# Patient Record
Sex: Male | Born: 1985 | Race: White | Hispanic: No | Smoking: Current every day smoker
Health system: Southern US, Community
[De-identification: ages and names within clinical notes are randomized; demographics above are authoritative.]

## PROBLEM LIST (undated history)

## (undated) DIAGNOSIS — F319 Bipolar disorder, unspecified: Secondary | ICD-10-CM

---
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
## 2005-06-25 ENCOUNTER — Emergency Department: Payer: Self-pay | Admitting: Emergency Medicine

## 2006-08-09 ENCOUNTER — Emergency Department: Payer: Self-pay | Admitting: Emergency Medicine

## 2007-05-29 ENCOUNTER — Emergency Department: Payer: Self-pay

## 2007-07-08 ENCOUNTER — Emergency Department: Payer: Self-pay | Admitting: Emergency Medicine

## 2008-02-05 ENCOUNTER — Other Ambulatory Visit: Payer: Self-pay

## 2008-02-05 ENCOUNTER — Emergency Department: Payer: Self-pay | Admitting: Emergency Medicine

## 2008-02-09 ENCOUNTER — Emergency Department: Payer: Self-pay | Admitting: Emergency Medicine

## 2008-04-30 ENCOUNTER — Emergency Department: Payer: Self-pay | Admitting: Emergency Medicine

## 2008-08-18 ENCOUNTER — Inpatient Hospital Stay: Payer: Self-pay | Admitting: Psychiatry

## 2009-08-08 ENCOUNTER — Emergency Department: Payer: Self-pay | Admitting: Emergency Medicine

## 2010-03-24 ENCOUNTER — Emergency Department: Payer: Self-pay | Admitting: Emergency Medicine

## 2010-04-24 ENCOUNTER — Emergency Department: Payer: Self-pay | Admitting: Emergency Medicine

## 2011-06-02 ENCOUNTER — Emergency Department: Payer: Self-pay | Admitting: Emergency Medicine

## 2012-04-02 ENCOUNTER — Emergency Department: Payer: Self-pay | Admitting: Emergency Medicine

## 2012-04-30 ENCOUNTER — Emergency Department: Payer: Self-pay | Admitting: Internal Medicine

## 2012-06-13 ENCOUNTER — Emergency Department: Payer: Self-pay | Admitting: Emergency Medicine

## 2012-12-26 ENCOUNTER — Emergency Department: Payer: Self-pay | Admitting: Emergency Medicine

## 2013-01-02 ENCOUNTER — Emergency Department: Payer: Self-pay | Admitting: Unknown Physician Specialty

## 2013-01-02 LAB — COMPREHENSIVE METABOLIC PANEL
Albumin: 4.6 g/dL (ref 3.4–5.0)
Alkaline Phosphatase: 102 U/L (ref 50–136)
Bilirubin,Total: 0.7 mg/dL (ref 0.2–1.0)
Co2: 25 mmol/L (ref 21–32)
EGFR (Non-African Amer.): >60
Osmolality: 275 (ref 275–301)
SGOT(AST): 27 U/L (ref 15–37)
SGPT (ALT): 43 U/L (ref 12–78)
Sodium: 138 mmol/L (ref 136–145)
Total Protein: 8.3 g/dL — ABNORMAL HIGH (ref 6.4–8.2)

## 2013-01-02 LAB — TSH: Thyroid Stimulating Horm: 1.33 u[IU]/mL

## 2013-01-02 LAB — CBC
HCT: 41.2 % (ref 40.0–52.0)
MCH: 28.8 pg (ref 26.0–34.0)
Platelet: 311 10*3/uL (ref 150–440)
RBC: 4.95 10*6/uL (ref 4.40–5.90)
RDW: 13.1 % (ref 11.5–14.5)

## 2013-01-02 LAB — DRUG SCREEN, URINE
Amphetamines, Ur Screen: NEGATIVE (ref ?–1000)
Barbiturates, Ur Screen: NEGATIVE (ref ?–200)
Cannabinoid 50 Ng, Ur ~~LOC~~: POSITIVE (ref ?–50)
Cocaine Metabolite,Ur ~~LOC~~: POSITIVE (ref ?–300)
MDMA (Ecstasy)Ur Screen: NEGATIVE (ref ?–500)
Opiate, Ur Screen: NEGATIVE (ref ?–300)
Phencyclidine (PCP) Ur S: NEGATIVE (ref ?–25)

## 2013-01-02 LAB — URINALYSIS, COMPLETE
Bacteria: NONE SEEN
Bilirubin,UR: NEGATIVE
Glucose,UR: NEGATIVE mg/dL (ref 0–75)
Ketone: NEGATIVE
Protein: NEGATIVE
Specific Gravity: 1.016 (ref 1.003–1.030)
WBC UR: <1 /HPF (ref 0–5)

## 2013-04-17 ENCOUNTER — Emergency Department: Payer: Self-pay | Admitting: Emergency Medicine

## 2013-09-15 ENCOUNTER — Emergency Department: Payer: Self-pay | Admitting: Emergency Medicine

## 2013-09-15 LAB — URINALYSIS, COMPLETE
BACTERIA: NONE SEEN
BILIRUBIN, UR: NEGATIVE
Blood: NEGATIVE
GLUCOSE, UR: NEGATIVE mg/dL (ref 0–75)
Ketone: NEGATIVE
LEUKOCYTE ESTERASE: NEGATIVE
Nitrite: NEGATIVE
Ph: 6 (ref 4.5–8.0)
Protein: NEGATIVE
Specific Gravity: 1.02 (ref 1.003–1.030)
Squamous Epithelial: NONE SEEN

## 2013-11-27 ENCOUNTER — Emergency Department: Payer: Self-pay | Admitting: Internal Medicine

## 2014-11-16 ENCOUNTER — Emergency Department: Payer: Self-pay | Admitting: Emergency Medicine
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
## 2015-01-26 ENCOUNTER — Encounter: Payer: Self-pay | Admitting: Emergency Medicine

## 2015-01-26 ENCOUNTER — Emergency Department
Admission: EM | Admit: 2015-01-26 | Discharge: 2015-01-26 | Disposition: A | Discharge status: Home / Self Care | Payer: Self-pay | Attending: Emergency Medicine | Admitting: Emergency Medicine

## 2015-01-26 DIAGNOSIS — W25XXXS Contact with sharp glass, sequela: Secondary | ICD-10-CM | POA: Insufficient documentation

## 2015-01-26 DIAGNOSIS — S61511S Laceration without foreign body of right wrist, sequela: Secondary | ICD-10-CM | POA: Insufficient documentation

## 2015-01-26 DIAGNOSIS — Z720 Tobacco use: Secondary | ICD-10-CM | POA: Insufficient documentation

## 2015-01-26 DIAGNOSIS — S41111S Laceration without foreign body of right upper arm, sequela: Secondary | ICD-10-CM

## 2015-01-26 MED ORDER — IBUPROFEN 800 MG PO TABS: 800.0000 mg | ORAL_TABLET | Freq: Three times a day (TID) | ORAL | Status: AC | PRN

## 2015-01-26 MED ORDER — OXYCODONE-ACETAMINOPHEN 5-325 MG PO TABS: 1.0000 | ORAL_TABLET | ORAL | Status: DC | PRN

## 2015-01-26 NOTE — ED Notes (Signed)
Had laceration sewn 6 weeks ago, no follow up, painful now

## 2015-01-26 NOTE — ED Provider Notes (Signed)
[facility] Emergency Department Provider Note  ____________________________________________  Time seen: Approximately 2:12 PM  I have reviewed the triage vital signs and the nursing notes.   HISTORY  Chief Complaint Wrist Pain   HPI Kevin Curry is a 29 y.o. male who presents with continued wrist pain secondary to laceration to his right wrist approximately 6 weeks ago. Denies any additional trauma, however complains of increased pain with trying to work. Does not have any insurance and is coming on the use of his arm and hand for work. States he would like a refill on antibiotics and pain medication. His pain as an 8/10 this morning which is worse it's been   History reviewed. No pertinent past medical history.  There are no active problems to display for this patient.   History reviewed. No pertinent past surgical history.  Current Outpatient Rx  Name  Route  Sig  Dispense  Refill  . ibuprofen (ADVIL,MOTRIN) 800 MG tablet   Oral   Take 1 tablet (800 mg total) by mouth every 8 (eight) hours as needed.   30 tablet   0   . oxyCODONE-acetaminophen (ROXICET) 5-325 MG per tablet   Oral   Take 1-2 tablets by mouth every 4 (four) hours as needed for severe pain.   15 tablet   0     Allergies Codeine  No family history on file.  Social History History  Substance Use Topics  . Smoking status: Current Every Day Smoker -- 0.50 packs/day    Types: Cigarettes  . Smokeless tobacco: Not on file  . Alcohol Use: No    Review of Systems Constitutional: No fever/chills Eyes: No visual changes. ENT: No sore throat. Cardiovascular: Denies chest pain. Respiratory: Denies shortness of breath. Gastrointestinal: No abdominal pain.  No nausea, no vomiting.  No diarrhea.  No constipation. Genitourinary: Negative for dysuria. Musculoskeletal: Negative for back pain. Skin: Negative for rash. Neurological: Negative for headaches, focal weakness or  numbness.  10-point ROS otherwise negative.  ____________________________________________   PHYSICAL EXAM:  VITAL SIGNS: ED Triage Vitals  Enc Vitals Group     BP 01/26/15 1334 141/76 mmHg     Pulse Rate 01/26/15 1334 60     Resp 01/26/15 1334 18     Temp 01/26/15 1334 98.2 F (36.8 C)     Temp Source 01/26/15 1334 Oral     SpO2 01/26/15 1334 100 %     Weight 01/26/15 1334 210 lb (95.255 kg)     Height 01/26/15 1334 6\' 1"  (1.854 m)     Head Cir --      Peak Flow --      Pain Score 01/26/15 1335 8     Pain Loc --      Pain Edu? --      Excl. in GC? --     Constitutional: Alert and oriented. Well appearing and in no acute distress. Eyes: Conjunctivae are normal. PERRL. EOMI. Head: Atraumatic. Nose: No congestion/rhinnorhea. Mouth/Throat: Mucous membranes are moist.  Oropharynx non-erythematous. Neck: No stridor.   Cardiovascular: Normal rate, regular rhythm. Grossly normal heart sounds.  Good peripheral circulation. Respiratory: Normal respiratory effort.  No retractions. Lungs CTAB. Gastrointestinal: Soft and nontender. No distention. No abdominal bruits. No CVA tenderness. Musculoskeletal: No lower extremity tenderness nor edema.  No joint effusions. Neurologic:  Normal speech and language. No gross focal neurologic deficits are appreciated. Speech is normal. No gait instability. Skin:  Skin is warm, dry and intact. No  rash noted. Laceration right wrist healed no evidence of erythema positive strength noted bilateral good distal capillary refills. Fingers extremely greasy from mechanics. Psychiatric: Mood and affect are normal. Speech and behavior are normal.  ____________________________________________   LABS (all labs ordered are listed, but only abnormal results are displayed)  Labs Reviewed - No data to display ____________________________________________  EKG  Not  applicable ____________________________________________  RADIOLOGY  None  ____________________________________________   PROCEDURES  Procedure(s) performed: None  Critical Care performed: No  ____________________________________________   INITIAL IMPRESSION / ASSESSMENT AND PLAN / ED COURSE  Pertinent labs & imaging results that were available during my care of the patient were reviewed by me and considered in my medical decision making (see chart for details). NCCS[place] reviewed. No history of controlled drug use. Will treat for 3 days of pain management. Patient understands this is not a pain clinic and he will need to see the orthopedic surgeon as needed for continued pain in his wrist. No antibiotics needed at this time. Patient voices no other emergency medical complaints today.  ____________________________________________   FINAL CLINICAL IMPRESSION(S) / ED DIAGNOSES  Final diagnoses:  Laceration of arm, right, complicated, sequela      Evangeline Dakin, PA-C 01/26/15 1425  Phineas Semen, MD 01/26/15 [phone]

## 2015-02-08 ENCOUNTER — Emergency Department
Admission: EM | Admit: 2015-02-08 | Discharge: 2015-02-08 | Disposition: A | Discharge status: Home / Self Care | Payer: Self-pay | Attending: Emergency Medicine | Admitting: Emergency Medicine

## 2015-02-08 DIAGNOSIS — X58XXXS Exposure to other specified factors, sequela: Secondary | ICD-10-CM | POA: Insufficient documentation

## 2015-02-08 DIAGNOSIS — Z720 Tobacco use: Secondary | ICD-10-CM | POA: Insufficient documentation

## 2015-02-08 DIAGNOSIS — S61511S Laceration without foreign body of right wrist, sequela: Secondary | ICD-10-CM | POA: Insufficient documentation

## 2015-02-08 MED ORDER — NAPROXEN 500 MG PO TABS
500.0000 mg | ORAL_TABLET | Freq: Once | ORAL | Status: AC
  Administered 2015-02-08: 500 mg via ORAL

## 2015-02-08 MED ORDER — NAPROXEN 500 MG PO TABS
ORAL_TABLET | ORAL | Status: AC
  Filled 2015-02-08: qty 1

## 2015-02-08 MED ORDER — NAPROXEN SODIUM 275 MG PO TABS: 275.0000 mg | ORAL_TABLET | Freq: Two times a day (BID) | ORAL | Status: AC

## 2015-02-08 NOTE — ED Notes (Signed)
Pt c/o right wrist pain , states he cut it about a month ago involving tendons and was told to return for continued sx due to lack of insurance, etc.

## 2015-02-08 NOTE — Discharge Instructions (Signed)
Advised patient to follow up with Ortho clinic for definitive care.  ER visits will not resolved this problem.  No need for antibiotics or naracotic pain medications.

## 2015-02-08 NOTE — ED Provider Notes (Signed)
[facility] Emergency Department Provider Note  ____________________________________________  Time seen: Approximately 3:50 PM  I have reviewed the triage vital signs and the nursing notes.   HISTORY  Chief Complaint Wrist Pain    HPI Kevin Curry is a 29 y.o. male patient returns to ER status post laceration with possible involvement of the tendon of the right wrist which  occurred6 weeks ago. This patient's second visit since suture removal. Patient state he was told to continue to follow with the emergency room due to lack of insurance. Read discharge instructions to the patient from his last visit was told him to follow orthopedics and stated the emergency room. Patient state continues to work but limited capacity secondary to the sequela of his right wrist. Patient is rating his pain as a 7/10. It is noted that the patient is left-hand dominant. States flexion and extension of the wrists increases the pain states no palliative measures as he is out of narcotic pain medication.   History reviewed. No pertinent past medical history.  There are no active problems to display for this patient.   History reviewed. No pertinent past surgical history.  Current Outpatient Rx  Name  Route  Sig  Dispense  Refill  . ibuprofen (ADVIL,MOTRIN) 800 MG tablet   Oral   Take 1 tablet (800 mg total) by mouth every 8 (eight) hours as needed.   30 tablet   0   . naproxen sodium (ANAPROX) 275 MG tablet   Oral   Take 1 tablet (275 mg total) by mouth 2 (two) times daily with a meal.   30 tablet   2   . oxyCODONE-acetaminophen (ROXICET) 5-325 MG per tablet   Oral   Take 1-2 tablets by mouth every 4 (four) hours as needed for severe pain.   15 tablet   0     Allergies Codeine  No family history on file.  Social History History  Substance Use Topics  . Smoking status: Current Every Day Smoker -- 0.50 packs/day    Types: Cigarettes  . Smokeless  tobacco: Not on file  . Alcohol Use: No    Review of Systems Constitutional: No fever/chills Eyes: No visual changes. ENT: No sore throat. Cardiovascular: Denies chest pain. Respiratory: Denies shortness of breath. Gastrointestinal: No abdominal pain.  No nausea, no vomiting.  No diarrhea.  No constipation. Genitourinary: Negative for dysuria. Musculoskeletal: Right wrist pain Skin: Healed laceration to the right wrist. Neurological: Negative for headaches, focal weakness or numbness. 10-point ROS otherwise negative.  ____________________________________________   PHYSICAL EXAM:  VITAL SIGNS: ED Triage Vitals  Enc Vitals Group     BP 02/08/15 1406 128/89 mmHg     Pulse Rate 02/08/15 1406 91     Resp 02/08/15 1406 16     Temp 02/08/15 1406 98.3 F (36.8 C)     Temp Source 02/08/15 1406 Oral     SpO2 02/08/15 1406 98 %     Weight 02/08/15 1405 210 lb (95.255 kg)     Height 02/08/15 1405  (1.854 m)     Head Cir --      Peak Flow --      Pain Score 02/08/15 1405 7     Pain Loc --      Pain Edu? --      Excl. in GC? --     Constitutional: Alert and oriented. Well appearing and in no acute distress. Patient left-hand dominant Eyes: Conjunctivae are normal.  PERRL. EOMI. Head: Atraumatic. Nose: No congestion/rhinnorhea. Mouth/Throat: Mucous membranes are moist.  Oropharynx non-erythematous. Neck: No stridor.  No deformity for nuchal range of motion nontender palpation. Hematological/Lymphatic/Immunilogical: No cervical lymphadenopathy. Cardiovascular: Normal rate, regular rhythm. Grossly normal heart sounds.  Good peripheral circulation. Respiratory: Normal respiratory effort.  No retractions. Lungs CTAB. Gastrointestinal: Soft and nontender. No distention. No abdominal bruits. No CVA tenderness. Musculoskeletal: No deformity of the right wrist is neurovascular intact no obvious swelling. Patient is demonstrating full range of motion to complain of pain. Neurologic:   Normal speech and language. No gross focal neurologic deficits are appreciated. Speech is normal. No gait instability. Skin:  Skin is warm, dry and intact. No rash noted. Psychiatric: Mood and affect are normal. Speech and behavior are normal.  ____________________________________________   LABS (all labs ordered are listed, but only abnormal results are displayed)  Labs Reviewed - No data to display ____________________________________________  EKG   ____________________________________________  RADIOLOGY   ____________________________________________   PROCEDURES  Procedure(s) performed: None  Critical Care performed: No  ____________________________________________   INITIAL IMPRESSION / ASSESSMENT AND PLAN / ED COURSE  Pertinent labs & imaging results that were available during my care of the patient were reviewed by me and considered in my medical decision making (see chart for details).  Laceration of the right wrist with complications. ____________________________________________   FINAL CLINICAL IMPRESSION(S) / ED DIAGNOSES  Final diagnoses:  Laceration of wrist with complication, right, sequela      Kevin Reining, PA-C 02/08/15 1559  Sharyn Creamer, MD 02/09/15 [phone]

## 2015-07-17 ENCOUNTER — Encounter: Payer: Self-pay | Admitting: Emergency Medicine

## 2015-07-17 ENCOUNTER — Emergency Department: Payer: Self-pay

## 2015-07-17 ENCOUNTER — Emergency Department
Admission: EM | Admit: 2015-07-17 | Discharge: 2015-07-18 | Disposition: A | Discharge status: Home / Self Care | Payer: Self-pay | Attending: Emergency Medicine | Admitting: Emergency Medicine

## 2015-07-17 DIAGNOSIS — F17210 Nicotine dependence, cigarettes, uncomplicated: Secondary | ICD-10-CM | POA: Insufficient documentation

## 2015-07-17 DIAGNOSIS — Z791 Long term (current) use of non-steroidal anti-inflammatories (NSAID): Secondary | ICD-10-CM | POA: Insufficient documentation

## 2015-07-17 DIAGNOSIS — F1910 Other psychoactive substance abuse, uncomplicated: Secondary | ICD-10-CM

## 2015-07-17 DIAGNOSIS — F1410 Cocaine abuse, uncomplicated: Secondary | ICD-10-CM | POA: Insufficient documentation

## 2015-07-17 DIAGNOSIS — K0889 Other specified disorders of teeth and supporting structures: Secondary | ICD-10-CM | POA: Insufficient documentation

## 2015-07-17 DIAGNOSIS — R079 Chest pain, unspecified: Secondary | ICD-10-CM | POA: Insufficient documentation

## 2015-07-17 LAB — BASIC METABOLIC PANEL
ANION GAP: 6 (ref 5–15)
BUN: 13 mg/dL (ref 6–20)
CO2: 30 mmol/L (ref 22–32)
Calcium: 9.6 mg/dL (ref 8.9–10.3)
Chloride: 103 mmol/L (ref 101–111)
Creatinine, Ser: 1.27 mg/dL — ABNORMAL HIGH (ref 0.61–1.24)
GFR calc Af Amer: >60 mL/min (ref >60)
GFR calc non Af Amer: >60 mL/min (ref >60)
GLUCOSE: 88 mg/dL (ref 65–99)
POTASSIUM: 4.1 mmol/L (ref 3.5–5.1)
Sodium: 139 mmol/L (ref 135–145)

## 2015-07-17 LAB — CBC
HEMATOCRIT: 41.9 % (ref 40.0–52.0)
HEMOGLOBIN: 14.1 g/dL (ref 13.0–18.0)
MCH: 29.1 pg (ref 26.0–34.0)
MCHC: 33.6 g/dL (ref 32.0–36.0)
MCV: 86.5 fL (ref 80.0–100.0)
Platelets: 277 10*3/uL (ref 150–440)
RBC: 4.84 MIL/uL (ref 4.40–5.90)
RDW: 13.4 % (ref 11.5–14.5)
WBC: 14.7 10*3/uL — ABNORMAL HIGH (ref 3.8–10.6)

## 2015-07-17 LAB — TROPONIN I: Troponin I: <0.03 ng/mL (ref <0.031)

## 2015-07-17 NOTE — ED Provider Notes (Signed)
[facility] Emergency Department Provider Note  ____________________________________________  Time seen: Approximately 11:58 PM  I have reviewed the triage vital signs and the nursing notes.   HISTORY  Chief Complaint Chest Pain    HPI Kevin Curry is a 29 y.o. male who presents to the ED with a chief complaint of chest pain and dental pain. Patient reports a three-day history of central chest tightness, waxing/waning, lasting approximately one hour per episode. Tightness associated with radiating down his right arm with some shortness of breath during the episodes. Denies associated diaphoresis, nausea, dizziness, palpitations. Reports his father died 2 weeks ago, increased stress at work as well as cocaine use one week ago. Denies prior history of coronary artery disease. Denies recent travel or trauma.Also reports crown fell out of his upper left molar last week and complains of dental pain. He has been taking his girlfriend's amoxicillin.   Past medical history None   There are no active problems to display for this patient.   History reviewed. No pertinent past surgical history.  Current Outpatient Rx  Name  Route  Sig  Dispense  Refill  . ibuprofen (ADVIL,MOTRIN) 800 MG tablet   Oral   Take 1 tablet (800 mg total) by mouth every 8 (eight) hours as needed.   30 tablet   0   . naproxen sodium (ANAPROX) 275 MG tablet   Oral   Take 1 tablet (275 mg total) by mouth 2 (two) times daily with a meal.   30 tablet   2   . oxyCODONE-acetaminophen (ROXICET) 5-325 MG per tablet   Oral   Take 1-2 tablets by mouth every 4 (four) hours as needed for severe pain.   15 tablet   0     Allergies Codeine  Family History  Problem Relation Age of Onset  . Heart disease Father     Social History Social History  Substance Use Topics  . Smoking status: Current Every Day Smoker -- 0.50 packs/day for 5 years    Types: Cigarettes  . Smokeless  tobacco: None  . Alcohol Use: Yes     Comment: occasionaly    Review of Systems Constitutional: No fever/chills Eyes: No visual changes. ENT: Positive for dental pain. No sore throat. Cardiovascular: Positive for chest pain. Respiratory: Denies shortness of breath. Gastrointestinal: No abdominal pain.  No nausea, no vomiting.  No diarrhea.  No constipation. Genitourinary: Negative for dysuria. Musculoskeletal: Negative for back pain. Skin: Negative for rash. Neurological: Negative for headaches, focal weakness or numbness.  10-point ROS otherwise negative.  ____________________________________________   PHYSICAL EXAM:  VITAL SIGNS: ED Triage Vitals  Enc Vitals Group     BP 07/17/15 2201 129/69 mmHg     Pulse Rate 07/17/15 2201 69     Resp 07/17/15 2201 18     Temp 07/17/15 2201 98.1 F (36.7 C)     Temp Source 07/17/15 2201 Oral     SpO2 07/17/15 2201 98 %     Weight 07/17/15 2201 200 lb (90.719 kg)     Height 07/17/15 2201  (1.854 m)     Head Cir --      Peak Flow --      Pain Score 07/17/15 2202 3     Pain Loc --      Pain Edu? --      Excl. in GC? --     Constitutional: Alert and oriented. Well appearing and in no acute distress. Eyes: Conjunctivae  are normal. PERRL. EOMI. Head: Atraumatic. Nose: No congestion/rhinnorhea. Mouth/Throat: Mucous membranes are moist.  Oropharynx non-erythematous. Crown missing from upper left molar. There is no associated intraoral or extra oral swelling. There is no abscess. Neck: No stridor.  No carotid bruits. Cardiovascular: Normal rate, regular rhythm. Grossly normal heart sounds.  Good peripheral circulation. Respiratory: Normal respiratory effort.  No retractions. Lungs CTAB. Gastrointestinal: Soft and nontender. No distention. No abdominal bruits. No CVA tenderness. Musculoskeletal: No lower extremity tenderness nor edema.  No joint effusions. Neurologic:  Normal speech and language. No gross focal neurologic deficits  are appreciated. No gait instability. Skin:  Skin is warm, dry and intact. No rash noted. Psychiatric: Mood and affect are normal. Speech and behavior are normal.  ____________________________________________   LABS (all labs ordered are listed, but only abnormal results are displayed)  Labs Reviewed  BASIC METABOLIC PANEL - Abnormal; Notable for the following:    Creatinine, Ser 1.27 (*)    All other components within normal limits  CBC - Abnormal; Notable for the following:    WBC 14.7 (*)    All other components within normal limits  TROPONIN I   ____________________________________________  EKG  ED ECG REPORT I, Pharell Rolfson J, the attending physician, personally viewed and interpreted this ECG.   Date: 07/18/2015  EKG Time: 2200  Rate: 72  Rhythm: normal EKG, normal sinus rhythm  Axis: Normal  Intervals:none  ST&T Change: Nonspecific  ____________________________________________  RADIOLOGY  Chest 2 view (viewed by me, interpreted per Dr. Manus GunningEhinger): No acute pulmonary process. ____________________________________________   PROCEDURES  Procedure(s) performed: None  Critical Care performed: No  ____________________________________________   INITIAL IMPRESSION / ASSESSMENT AND PLAN / ED COURSE  Pertinent labs & imaging results that were available during my care of the patient were reviewed by me and considered in my medical decision making (see chart for details).  29 year old male who presents with a 3 day history of chest pain; increased recent stressors as well as cocaine use. Patient has a normal EKG, negative troponin despite pain 3 days. Do not feel this is acute coronary syndrome but more likely secondary to stressors and cocaine use. Do not feel CK is indicated given patient is not complaining of muscle pain or spasms. Will initiate Ativan for pain; patient to continue amoxicillin for dental pain. Will refer patient to outpatient dental and cardiology  follow-ups. Strict return precautions given. Patient and girlfriend verbalize understanding and agree with plan of care. ____________________________________________   FINAL CLINICAL IMPRESSION(S) / ED DIAGNOSES  Final diagnoses:  Chest pain, unspecified chest pain type  Substance abuse  Pain, dental      Irean HongJade J Sharia Averitt, MD 07/18/15 [phone]

## 2015-07-17 NOTE — ED Notes (Addendum)
Patient states that he has had intermittent mid chest pain radiating down right arm times three days. Patient reports that he has had some shortness of breath with his chest pain. Patient reports using cocaine about 1 week ago.

## 2015-07-18 MED ORDER — LORAZEPAM 1 MG PO TABS
1.0000 mg | ORAL_TABLET | Freq: Once | ORAL | Status: AC
  Administered 2015-07-18: 1 mg via ORAL
  Filled 2015-07-18: qty 1

## 2015-07-18 MED ORDER — ACETAMINOPHEN 500 MG PO TABS
1000.0000 mg | ORAL_TABLET | Freq: Once | ORAL | Status: AC
  Administered 2015-07-18: 1000 mg via ORAL
  Filled 2015-07-18: qty 2

## 2015-07-18 NOTE — ED Notes (Signed)
Pt requesting pain meds for tooth pain upper left (throbbing) 10/10 to nothing

## 2015-07-18 NOTE — Discharge Instructions (Signed)
1. Avoid illicit drugs. 2. Continue amoxicillin for dental pain. 3. Return to the ER for worsening symptoms, persistent vomiting, difficulty breathing or other concerns.  Nonspecific Chest Pain  Chest pain can be caused by many different conditions. There is always a chance that your pain could be related to something serious, such as a heart attack or a blood clot in your lungs. Chest pain can also be caused by conditions that are not life-threatening. If you have chest pain, it is very important to follow up with your health care provider. CAUSES  Chest pain can be caused by:  Heartburn.  Pneumonia or bronchitis.  Anxiety or stress.  Inflammation around your heart (pericarditis) or lung (pleuritis or pleurisy).  A blood clot in your lung.  A collapsed lung (pneumothorax). It can develop suddenly on its own (spontaneous pneumothorax) or from trauma to the chest.  Shingles infection (varicella-zoster virus).  Heart attack.  Damage to the bones, muscles, and cartilage that make up your chest wall. This can include:  Bruised bones due to injury.  Strained muscles or cartilage due to frequent or repeated coughing or overwork.  Fracture to one or more ribs.  Sore cartilage due to inflammation (costochondritis). RISK FACTORS  Risk factors for chest pain may include:  Activities that increase your risk for trauma or injury to your chest.  Respiratory infections or conditions that cause frequent coughing.  Medical conditions or overeating that can cause heartburn.  Heart disease or family history of heart disease.  Conditions or health behaviors that increase your risk of developing a blood clot.  Having had chicken pox (varicella zoster). SIGNS AND SYMPTOMS Chest pain can feel like:  Burning or tingling on the surface of your chest or deep in your chest.  Crushing, pressure, aching, or squeezing pain.  Dull or sharp pain that is worse when you move, cough, or take a  deep breath.  Pain that is also felt in your back, neck, shoulder, or arm, or pain that spreads to any of these areas. Your chest pain may come and go, or it may stay constant. DIAGNOSIS Lab tests or other studies may be needed to find the cause of your pain. Your health care provider may have you take a test called an ambulatory ECG (electrocardiogram). An ECG records your heartbeat patterns at the time the test is performed. You may also have other tests, such as:  Transthoracic echocardiogram (TTE). During echocardiography, sound waves are used to create a picture of all of the heart structures and to look at how blood flows through your heart.  Transesophageal echocardiogram (TEE).This is a more advanced imaging test that obtains images from inside your body. It allows your health care provider to see your heart in finer detail.  Cardiac monitoring. This allows your health care provider to monitor your heart rate and rhythm in real time.  Holter monitor. This is a portable device that records your heartbeat and can help to diagnose abnormal heartbeats. It allows your health care provider to track your heart activity for several days, if needed.  Stress tests. These can be done through exercise or by taking medicine that makes your heart beat more quickly.  Blood tests.  Imaging tests. TREATMENT  Your treatment depends on what is causing your chest pain. Treatment may include:  Medicines. These may include:  Acid blockers for heartburn.  Anti-inflammatory medicine.  Pain medicine for inflammatory conditions.  Antibiotic medicine, if an infection is present.  Medicines to dissolve blood  clots.  Medicines to treat coronary artery disease.  Supportive care for conditions that do not require medicines. This may include:  Resting.  Applying heat or cold packs to injured areas.  Limiting activities until pain decreases. HOME CARE INSTRUCTIONS  If you were prescribed an  antibiotic medicine, finish it all even if you start to feel better.  Avoid any activities that bring on chest pain.  Do not use any tobacco products, including cigarettes, chewing tobacco, or electronic cigarettes. If you need help quitting, ask your health care provider.  Do not drink alcohol.  Take medicines only as directed by your health care provider.  Keep all follow-up visits as directed by your health care provider. This is important. This includes any further testing if your chest pain does not go away.  If heartburn is the cause for your chest pain, you may be told to keep your head raised (elevated) while sleeping. This reduces the chance that acid will go from your stomach into your esophagus.  Make lifestyle changes as directed by your health care provider. These may include:  Getting regular exercise. Ask your health care provider to suggest some activities that are safe for you.  Eating a heart-healthy diet. A registered dietitian can help you to learn healthy eating options.  Maintaining a healthy weight.  Managing diabetes, if necessary.  Reducing stress. SEEK MEDICAL CARE IF:  Your chest pain does not go away after treatment.  You have a rash with blisters on your chest.  You have a fever. SEEK IMMEDIATE MEDICAL CARE IF:   Your chest pain is worse.  You have an increasing cough, or you cough up blood.  You have severe abdominal pain.  You have severe weakness.  You faint.  You have chills.  You have sudden, unexplained chest discomfort.  You have sudden, unexplained discomfort in your arms, back, neck, or jaw.  You have shortness of breath at any time.  You suddenly start to sweat, or your skin gets clammy.  You feel nauseous or you vomit.  You suddenly feel light-headed or dizzy.  Your heart begins to beat quickly, or it feels like it is skipping beats. These symptoms may represent a serious problem that is an emergency. Do not wait to  see if the symptoms will go away. Get medical help right away. Call your local emergency services (911 in the [place] Do not drive yourself to the hospital.   This information is not intended to replace advice given to you by your health care provider. Make sure you discuss any questions you have with your health care provider.   Document Released: 05/25/2005 Document Revised: 09/05/2014 Document Reviewed: 03/21/2014 Elsevier Interactive Patient Education 2016 [employer].  Polysubstance Abuse When people abuse more than one drug or type of drug it is called polysubstance or polydrug abuse. For example, many smokers also drink alcohol. This is one form of polydrug abuse. Polydrug abuse also refers to the use of a drug to counteract an unpleasant effect produced by another drug. It may also be used to help with withdrawal from another drug. People who take stimulants may become agitated. Sometimes this agitation is countered with a tranquilizer. This helps protect against the unpleasant side effects. Polydrug abuse also refers to the use of different drugs at the same time.  Anytime drug use is interfering with normal living activities, it has become abuse. This includes problems with family and friends. Psychological dependence has developed when your mind tells you that  the drug is needed. This is usually followed by physical dependence which has developed when continuing increases of drug are required to get the same feeling or "high". This is known as addiction or chemical dependency. A person's risk is much higher if there is a history of chemical dependency in the family. SIGNS OF CHEMICAL DEPENDENCY  You have been told by friends or family that drugs have become a problem.  You fight when using drugs.  You are having blackouts (not remembering what you do while using).  You feel sick from using drugs but continue using.  You lie about use or amounts of drugs (chemicals) used.  You need  chemicals to get you going.  You are suffering in work performance or in school because of drug use.  You get sick from use of drugs but continue to use anyway.  You need drugs to relate to people or feel comfortable in social situations.  You use drugs to forget problems. "Yes" answered to any of the above signs of chemical dependency indicates there are problems. The longer the use of drugs continues, the greater the problems will become. If there is a family history of drug or alcohol use, it is best not to experiment with these drugs. Continual use leads to tolerance. After tolerance develops more of the drug is needed to get the same feeling. This is followed by addiction. With addiction, drugs become the most important part of life. It becomes more important to take drugs than participate in the other usual activities of life. This includes relating to friends and family. Addiction is followed by dependency. Dependency is a condition where drugs are now needed not just to get high, but to feel normal. Addiction cannot be cured but it can be stopped. This often requires outside help and the care of professionals. Treatment centers are listed in the yellow pages under: Cocaine, Narcotics, and Alcoholics Anonymous. Most hospitals and clinics can refer you to a specialized care center. Talk to your caregiver if you need help.   This information is not intended to replace advice given to you by your health care provider. Make sure you discuss any questions you have with your health care provider.   Document Released: 04/06/2005 Document Revised: 11/07/2011 Document Reviewed: 08/20/2014 Elsevier Interactive Patient Education [employer]2016 Elsevier Inc.

## 2016-04-10 ENCOUNTER — Emergency Department
Admission: EM | Admit: 2016-04-10 | Discharge: 2016-04-10 | Disposition: A | Discharge status: Home / Self Care | Payer: Self-pay | Attending: Emergency Medicine | Admitting: Emergency Medicine

## 2016-04-10 ENCOUNTER — Encounter: Payer: Self-pay | Admitting: *Deleted

## 2016-04-10 DIAGNOSIS — F17210 Nicotine dependence, cigarettes, uncomplicated: Secondary | ICD-10-CM | POA: Insufficient documentation

## 2016-04-10 DIAGNOSIS — M62830 Muscle spasm of back: Secondary | ICD-10-CM | POA: Insufficient documentation

## 2016-04-10 MED ORDER — NAPROXEN 500 MG PO TABS: 500.0000 mg | ORAL_TABLET | Freq: Two times a day (BID) | ORAL | 2 refills | Status: DC

## 2016-04-10 NOTE — ED Triage Notes (Signed)
Pt reports walking up a hill feeling a pull in right lower back, pt denies any other symptoms

## 2016-04-10 NOTE — ED Provider Notes (Signed)
[facility] Emergency Department Provider Note   ____________________________________________    I have reviewed the triage vital signs and the nursing notes.   HISTORY  Chief Complaint Back Pain     HPI Kevin Curry is a 30 y.o. male who presents with complaints of right lower back pain. Patient reports he was walking into a store and developed a spasm in his right lower back. He reports the pain is severe and 10 out of 10. He is ambulating well. No neuro deficits.   History reviewed. No pertinent past medical history.  There are no active problems to display for this patient.   History reviewed. No pertinent surgical history.  Prior to Admission medications   Medication Sig Start Date End Date Taking? Authorizing Provider  ibuprofen (ADVIL,MOTRIN) 800 MG tablet Take 1 tablet (800 mg total) by mouth every 8 (eight) hours as needed. 01/26/15   Charmayne Sheer Beers, PA-C  naproxen (NAPROSYN) 500 MG tablet Take 1 tablet (500 mg total) by mouth 2 (two) times daily with a meal. 04/10/16   Jene Every, MD  oxyCODONE-acetaminophen (ROXICET) 5-325 MG per tablet Take 1-2 tablets by mouth every 4 (four) hours as needed for severe pain. 01/26/15   Evangeline Dakin, PA-C     Allergies Codeine  Family History  Problem Relation Age of Onset  . Heart disease Father     Social History Social History  Substance Use Topics  . Smoking status: Current Every Day Smoker    Packs/day: 0.50    Years: 5.00    Types: Cigarettes  . Smokeless tobacco: Never Used  . Alcohol use Yes     Comment: occasionaly    Review of Systems  Constitutional: No Dizziness     Gastrointestinal: No abdominal pain.   Genitourinary: Negative for incontinence Musculoskeletal: As above.  Neurological: Negative for focal deficits    ____________________________________________   PHYSICAL EXAM:  VITAL SIGNS: ED Triage Vitals  Enc Vitals Group     BP 04/10/16  1148 (!) 143/75     Pulse Rate 04/10/16 1148 66     Resp 04/10/16 1148 18     Temp 04/10/16 1148 98.3 F (36.8 C)     Temp Source 04/10/16 1148 Oral     SpO2 04/10/16 1148 99 %     Weight 04/10/16 1144 200 lb (90.7 kg)     Height 04/10/16 1144  (1.854 m)     Head Circumference --      Peak Flow --      Pain Score 04/10/16 1144 10     Pain Loc --      Pain Edu? --      Excl. in GC? --      Constitutional: Alert and oriented. No acute distress. Pleasant and interactive   Respiratory: Normal respiratory effort.  No retractions. Genitourinary: deferred Musculoskeletal: Tenderness to palpation right lumbar paraspinal area consistent with muscle spasm. Trachea lower extremities. Neurologic:  Normal speech and language. No gross focal neurologic deficits are appreciated.   Skin:  Skin is warm, dry and intact. No rash noted.   ____________________________________________   LABS (all labs ordered are listed, but only abnormal results are displayed)  Labs Reviewed - No data to display ____________________________________________  EKG   ____________________________________________  RADIOLOGY  None ____________________________________________   PROCEDURES  Procedure(s) performed: No    Critical Care performed: No ____________________________________________   INITIAL IMPRESSION / ASSESSMENT AND PLAN / ED COURSE  Pertinent  labs & imaging results that were available during my care of the patient were reviewed by me and considered in my medical decision making (see chart for details).  History of present illness and exam is consistent with muscle spasm. We will treat conservatively with NSAIDs. Follow-up with PCP   ____________________________________________   FINAL CLINICAL IMPRESSION(S) / ED DIAGNOSES  Final diagnoses:  Muscle spasm of back      NEW MEDICATIONS STARTED DURING THIS VISIT:  Discharge Medication List as of 04/10/2016 12:06 PM      START taking these medications   Details  naproxen (NAPROSYN) 500 MG tablet Take 1 tablet (500 mg total) by mouth 2 (two) times daily with a meal., Starting Sun 04/10/2016, Print         Note:  This document was prepared using Dragon voice recognition software and may include unintentional dictation errors.    Jene Everyobert Durk Carmen, MD 04/10/16 1236

## 2016-08-04 ENCOUNTER — Emergency Department: Payer: Self-pay

## 2016-08-04 ENCOUNTER — Emergency Department
Admission: EM | Admit: 2016-08-04 | Discharge: 2016-08-04 | Disposition: A | Discharge status: Home / Self Care | Payer: Self-pay | Attending: Emergency Medicine | Admitting: Emergency Medicine

## 2016-08-04 ENCOUNTER — Encounter: Payer: Self-pay | Admitting: *Deleted

## 2016-08-04 DIAGNOSIS — F17210 Nicotine dependence, cigarettes, uncomplicated: Secondary | ICD-10-CM | POA: Insufficient documentation

## 2016-08-04 DIAGNOSIS — R079 Chest pain, unspecified: Secondary | ICD-10-CM | POA: Insufficient documentation

## 2016-08-04 DIAGNOSIS — Z791 Long term (current) use of non-steroidal anti-inflammatories (NSAID): Secondary | ICD-10-CM | POA: Insufficient documentation

## 2016-08-04 DIAGNOSIS — M542 Cervicalgia: Secondary | ICD-10-CM | POA: Insufficient documentation

## 2016-08-04 DIAGNOSIS — R0602 Shortness of breath: Secondary | ICD-10-CM | POA: Insufficient documentation

## 2016-08-04 DIAGNOSIS — M79662 Pain in left lower leg: Secondary | ICD-10-CM | POA: Insufficient documentation

## 2016-08-04 DIAGNOSIS — M79605 Pain in left leg: Secondary | ICD-10-CM

## 2016-08-04 LAB — BASIC METABOLIC PANEL
Anion gap: 11 (ref 5–15)
BUN: 13 mg/dL (ref 6–20)
CO2: 22 mmol/L (ref 22–32)
Calcium: 9.4 mg/dL (ref 8.9–10.3)
Chloride: 106 mmol/L (ref 101–111)
Creatinine, Ser: 0.89 mg/dL (ref 0.61–1.24)
GFR calc Af Amer: >60 mL/min (ref >60)
GFR calc non Af Amer: >60 mL/min (ref >60)
GLUCOSE: 108 mg/dL — AB (ref 65–99)
POTASSIUM: 3.7 mmol/L (ref 3.5–5.1)
SODIUM: 139 mmol/L (ref 135–145)

## 2016-08-04 LAB — CBC
HEMATOCRIT: 41.2 % (ref 40.0–52.0)
Hemoglobin: 14.4 g/dL (ref 13.0–18.0)
MCH: 30.3 pg (ref 26.0–34.0)
MCHC: 35 g/dL (ref 32.0–36.0)
MCV: 86.5 fL (ref 80.0–100.0)
PLATELETS: 272 10*3/uL (ref 150–440)
RBC: 4.76 MIL/uL (ref 4.40–5.90)
RDW: 13 % (ref 11.5–14.5)
WBC: 16.1 10*3/uL — ABNORMAL HIGH (ref 3.8–10.6)

## 2016-08-04 LAB — TROPONIN I: Troponin I: <0.03 ng/mL (ref <0.03)

## 2016-08-04 MED ORDER — GI COCKTAIL ~~LOC~~: 30.0000 mL | Freq: Once | ORAL | Status: DC

## 2016-08-04 MED ORDER — IOPAMIDOL (ISOVUE-370) INJECTION 76%
125.0000 mL | Freq: Once | INTRAVENOUS | Status: AC | PRN
  Administered 2016-08-04: 125 mL via INTRAVENOUS
  Filled 2016-08-04: qty 125

## 2016-08-04 MED ORDER — SODIUM CHLORIDE 0.9 % IV BOLUS (SEPSIS)
1000.0000 mL | Freq: Once | INTRAVENOUS | Status: AC
  Administered 2016-08-04: 1000 mL via INTRAVENOUS

## 2016-08-04 NOTE — ED Triage Notes (Signed)
Pt complains of central chest pain with pain radiating to neck, pt complains of dizziness

## 2016-08-04 NOTE — ED Notes (Signed)
Patient transported to CT 

## 2016-08-04 NOTE — ED Provider Notes (Signed)
[facility] Emergency Department Provider Note  ____________________________________________  Time seen: Approximately 6:59 PM  I have reviewed the triage vital signs and the nursing notes.   HISTORY  Chief Complaint Chest Pain    HPI Kevin Curry is a 30 y.o. male , otherwise healthy, presenting with leg, chest, and neck pain.  The patient reports that he was lying down watching a movie when he rolled over and had an acute severe pain that started in the mid thigh and shot up towards his chest and into the posterior neck. It felt like a thunder bowl of lightening to him. He had an immediate episode of "blacking out" that was very short and self limited. He has had associated diaphoresis and shortness of breath. At this time, he continues to have a central and left-sided chest pain that feels like heartburn, "burning sensation." He denies any numbness tingling or weakness, no dizziness or lightheadedness. No syncope.  Family history is negative for connective tissue disorder.   History reviewed. No pertinent past medical history.  There are no active problems to display for this patient.   History reviewed. No pertinent surgical history.  Current Outpatient Rx  . Order #: [id number] Class: Print  . Order #: [id number] Class: Print  . Order #: [id number] Class: Print    Allergies Codeine  Family History  Problem Relation Age of Onset  . Heart disease Father     Social History Social History  Substance Use Topics  . Smoking status: Current Every Day Smoker    Packs/day: 0.50    Years: 5.00    Types: Cigarettes  . Smokeless tobacco: Never Used  . Alcohol use Yes     Comment: occasionaly    Review of Systems Constitutional: No fever/chills.Positive diaphoresis. Eyes: No visual changes. ENT: No sore throat. No congestion or rhinorrhea. Cardiovascular: Positive chest pain. Denies palpitations. Respiratory: Positive shortness of breath.   No cough. Gastrointestinal: No abdominal pain.  No nausea, no vomiting.  No diarrhea.  No constipation. Genitourinary: Negative for dysuria. Musculoskeletal: Negative for back pain. Positive left leg pain. Skin: Negative for rash. Neurological: Negative for headaches. No focal numbness, tingling or weakness.   10-point ROS otherwise negative.  ____________________________________________   PHYSICAL EXAM:  VITAL SIGNS: ED Triage Vitals [08/04/16 1747]  Enc Vitals Group     BP (!) 158/96     Pulse Rate (!) 117     Resp 20     Temp 99.3 F (37.4 C)     Temp Source Oral     SpO2 96 %     Weight 190 lb (86.2 kg)     Height 6\' 1"  (1.854 m)     Head Circumference      Peak Flow      Pain Score      Pain Loc      Pain Edu?      Excl. in GC?     Constitutional: Alert and oriented. Well appearing and in no acute distress. Answers questions appropriately. Eyes: Conjunctivae are normal.  EOMI. No scleral icterus. Head: Atraumatic. Nose: No congestion/rhinnorhea. Mouth/Throat: Mucous membranes are moist.  Neck: No stridor.  Supple.  No JVD. Cardiovascular: Normal rate, regular rhythm. No murmurs, rubs or gallops.  Respiratory: Normal respiratory effort.  No accessory muscle use or retractions. Lungs CTAB.  No wheezes, rales or ronchi. Gastrointestinal: Soft, nontender and nondistended.  No guarding or rebound.  No peritoneal signs. Musculoskeletal: No LE edema. No ttp in the calves  or palpable cords.  Negative Homan's sign. Neurologic:  A&Ox3.  Speech is clear.  Face and smile are symmetric.  EOMI.  Moves all extremities well. Skin:  Skin is warm, dry and intact. No rash noted. Psychiatric: Mood and affect are normal. Speech and behavior are normal.  Normal judgement.  ____________________________________________   LABS (all labs ordered are listed, but only abnormal results are displayed)  Labs Reviewed  BASIC METABOLIC PANEL - Abnormal; Notable for the following:        Result Value   Glucose, Bld 108 (*)    All other components within normal limits  CBC - Abnormal; Notable for the following:    WBC 16.1 (*)    All other components within normal limits  TROPONIN I   ____________________________________________  EKG  ED ECG REPORT I, Rockne MenghiniNorman, Anne-Caroline, the attending physician, personally viewed and interpreted this ECG.   Date: 08/04/2016  EKG Time: 1752  Rate: 100  Rhythm: sinus tachycardia  Axis: normal  Intervals:none  ST&T Change: Nonspecific T-wave inversion. No ST elevation.  ____________________________________________  RADIOLOGY  Dg Chest 2 View  Result Date: 08/04/2016 CLINICAL DATA:  Left-sided sharp chest pain EXAM: CHEST  2 VIEW COMPARISON:  07/17/2015 FINDINGS: The heart size and mediastinal contours are within normal limits. Both lungs are clear. The visualized skeletal structures are unremarkable. IMPRESSION: No active cardiopulmonary disease. Electronically Signed   By: Elige KoHetal  Patel   On: 08/04/2016 18:29   Ct Angio Chest/abd/pel For Dissection W And/or Wo Contrast  Result Date: 08/04/2016 CLINICAL DATA:  Initial evaluation for acute central chest pain radiating to the back. Evaluate for dissection. EXAM: CT ANGIOGRAPHY CHEST, ABDOMEN AND PELVIS TECHNIQUE: Multidetector CT imaging through the chest, abdomen and pelvis was performed using the standard protocol during bolus administration of intravenous contrast. Multiplanar reconstructed images and MIPs were obtained and reviewed to evaluate the vascular anatomy. CONTRAST:  125 cc of Isovue 370. COMPARISON:  Prior radiograph from earlier same day. FINDINGS: CTA CHEST FINDINGS Cardiovascular: Precontrast imaging through the intrathoracic aorta at demonstrates no mural thrombus or other acute abnormality. Minimal plaque noted within the aortic arch. Postcontrast imaging demonstrates no evidence for dissection or other acute abnormality. No aneurysm. Visualized great vessels within  normal limits. Heart size normal. No pericardial effusion. Limited evaluation of the pulmonary arteries grossly unremarkable. Mediastinum/Nodes: Visualized thyroid is normal. No pathologically enlarged mediastinal, hilar, or axillary lymph nodes identified. Esophagus within normal limits. Lungs/Pleura: Lungs are clear without focal infiltrate, pulmonary edema, or pleural effusion. No pneumothorax. No worrisome pulmonary nodule or mass. Musculoskeletal: No acute osseous abnormality. No worrisome lytic or blastic osseous lesions. Review of the MIP images confirms the above findings. CTA ABDOMEN AND PELVIS FINDINGS VASCULAR Aorta: Intra-abdominal aorta of normal caliber and appearance without evidence for dissection or other acute abnormality. Mild plaque noted within the infrarenal aorta just above the bifurcation. No aneurysm. Celiac: Celiac axis and its branch vessels within normal limits. SMA: SMA widely patent. Renals: Single renal arteries present bilaterally and widely patent. IMA: IMA widely patent. Inflow: Iliac artery use widely patent bilaterally without stenosis or aneurysm. Mild plaque within the left internal iliac vessels noted. Veins: No venous abnormality identified. Review of the MIP images confirms the above findings. NON-VASCULAR Hepatobiliary: Mild diffuse hypodensity of the liver favored to be related to timing of the contrast bolus. Liver otherwise unremarkable. Gallbladder normal. No biliary dilatation. Pancreas: Pancreas within normal limits. Spleen: Spleen within normal limits. Adrenals/Urinary Tract: Adrenal glands are normal. Kidneys equal in  size with symmetric enhancement. No nephrolithiasis, hydronephrosis, or focal enhancing renal mass. Ureters within normal limits. Partially distended bladder within normal limits. Stomach/Bowel: Stomach within normal limits. No evidence for bowel obstruction. No acute inflammatory changes about the bowels. Appendix within normal limits. Lymphatic: No  adenopathy. Reproductive: Prostate normal. Other: No free air or fluid. Musculoskeletal: No acute osseous abnormality. No worrisome lytic or blastic osseous lesions. Few degenerative Schmorl's nodes noted within the spine. Review of the MIP images confirms the above findings. IMPRESSION: 1. No CTA evidence for acute aortic dissection or other acute aortic pathology. 2. No other acute abnormality within the chest, abdomen, and pelvis. Electronically Signed   By: Rise MuBenjamin  McClintock M.D.   On: 08/04/2016 19:54    ____________________________________________   PROCEDURES  Procedure(s) performed: None  Procedures  Critical Care performed: No ____________________________________________   INITIAL IMPRESSION / ASSESSMENT AND PLAN / ED COURSE  Pertinent labs & imaging results that were available during my care of the patient were reviewed by me and considered in my medical decision making (see chart for details).  30 y.o. male, otherwise healthy, presenting with acute onset of a severe shooting pain from the leg up to the neck, with associated ongoing chest pain. There are several possible causes including musculoskeletal or nerve related pain, but the only acute emergency that would affect all of these areas is aortic dissection. I'll plan to get a CT to rule this out given that the patient has ongoing pain. We will treat his chest pain with a GI cocktail and see if this helps. Plan reevaluation for final disposition.  ----------------------------------------- 8:04 PM on 08/04/2016 -----------------------------------------  The patient's workup in the emergency department does not show any evidence of an acute emergency. At this time, I'll plan to discharge the patient home with close PMD follow-up. Return precautions were discussed. ____________________________________________  FINAL CLINICAL IMPRESSION(S) / ED DIAGNOSES  Final diagnoses:  Pain of left lower extremity  Neck pain  Chest  pain, unspecified type    Clinical Course       NEW MEDICATIONS STARTED DURING THIS VISIT:  New Prescriptions   No medications on file      Rockne MenghiniAnne-Caroline Carmie Lanpher, MD 08/04/16 2005

## 2016-08-04 NOTE — Discharge Instructions (Signed)
Please eat small regular meals or other day. Make a follow-up appointment with a primary care physician.  Return to the emergency department if you develop severe pain, lightheadedness or fainting, shortness of breath, or any other symptoms concerning to you.

## 2017-04-16 ENCOUNTER — Emergency Department
Admission: EM | Admit: 2017-04-16 | Discharge: 2017-04-16 | Disposition: A | Discharge status: Home / Self Care | Payer: Self-pay | Attending: Emergency Medicine | Admitting: Emergency Medicine

## 2017-04-16 ENCOUNTER — Encounter: Payer: Self-pay | Admitting: Emergency Medicine

## 2017-04-16 DIAGNOSIS — Z79899 Other long term (current) drug therapy: Secondary | ICD-10-CM | POA: Insufficient documentation

## 2017-04-16 DIAGNOSIS — F69 Unspecified disorder of adult personality and behavior: Secondary | ICD-10-CM | POA: Insufficient documentation

## 2017-04-16 DIAGNOSIS — F319 Bipolar disorder, unspecified: Secondary | ICD-10-CM | POA: Insufficient documentation

## 2017-04-16 DIAGNOSIS — F17210 Nicotine dependence, cigarettes, uncomplicated: Secondary | ICD-10-CM | POA: Insufficient documentation

## 2017-04-16 HISTORY — DX: Bipolar disorder, unspecified: F31.9

## 2017-04-16 LAB — COMPREHENSIVE METABOLIC PANEL
ALT: 17 U/L (ref 17–63)
AST: 19 U/L (ref 15–41)
Albumin: 3.8 g/dL (ref 3.5–5.0)
Alkaline Phosphatase: 63 U/L (ref 38–126)
Anion gap: 6 (ref 5–15)
BUN: 11 mg/dL (ref 6–20)
CO2: 29 mmol/L (ref 22–32)
Calcium: 9.3 mg/dL (ref 8.9–10.3)
Chloride: 106 mmol/L (ref 101–111)
Creatinine, Ser: 1.05 mg/dL (ref 0.61–1.24)
GFR calc Af Amer: >60 mL/min (ref >60)
GFR calc non Af Amer: >60 mL/min (ref >60)
GLUCOSE: 107 mg/dL — AB (ref 65–99)
POTASSIUM: 4.3 mmol/L (ref 3.5–5.1)
SODIUM: 141 mmol/L (ref 135–145)
Total Bilirubin: 0.5 mg/dL (ref 0.3–1.2)
Total Protein: 6.5 g/dL (ref 6.5–8.1)

## 2017-04-16 LAB — URINE DRUG SCREEN, QUALITATIVE (ARMC ONLY)
Amphetamines, Ur Screen: NOT DETECTED
BARBITURATES, UR SCREEN: NOT DETECTED
Benzodiazepine, Ur Scrn: NOT DETECTED
COCAINE METABOLITE, UR ~~LOC~~: POSITIVE — AB
Cannabinoid 50 Ng, Ur ~~LOC~~: POSITIVE — AB
MDMA (Ecstasy)Ur Screen: NOT DETECTED
METHADONE SCREEN, URINE: NOT DETECTED
OPIATE, UR SCREEN: NOT DETECTED
Phencyclidine (PCP) Ur S: NOT DETECTED
Tricyclic, Ur Screen: NOT DETECTED

## 2017-04-16 LAB — CBC
HEMATOCRIT: 41.7 % (ref 40.0–52.0)
HEMOGLOBIN: 14 g/dL (ref 13.0–18.0)
MCH: 29.5 pg (ref 26.0–34.0)
MCHC: 33.5 g/dL (ref 32.0–36.0)
MCV: 87.8 fL (ref 80.0–100.0)
Platelets: 285 10*3/uL (ref 150–440)
RBC: 4.75 MIL/uL (ref 4.40–5.90)
RDW: 13.6 % (ref 11.5–14.5)
WBC: 11.7 10*3/uL — ABNORMAL HIGH (ref 3.8–10.6)

## 2017-04-16 LAB — ETHANOL

## 2017-04-16 NOTE — ED Notes (Signed)
Patient has been interviewed by [facility]; who states that Patient is to be discharged to home with referral information for marital and substance abuse services; Referral information for [facility] [employer] provided.

## 2017-04-16 NOTE — ED Triage Notes (Signed)
Pt brought in by [employer] under IVC. Per IVC paperwork, pt has hx/o bipolar and has been off of his medications. Pt also has hx/o SI attempts. Pt states that today him and his wife got into an argument and that he left and she called the cops on him and told them that he needed to have a mental evaluation.

## 2017-04-16 NOTE — ED Notes (Signed)

## 2017-04-16 NOTE — ED Notes (Signed)
Patient has irritable mood and does not want to be here.  Patient states, "my wife had a stroke a few years ago and she put me here.  She's the one that needs to be here.  I'm losing $50 an hour by being in here.  Someone put a machine in front of me twice and took it away."  "When do I get to see a doctor cause I want out of here.  Somebody with glasses sitting behind a desk gets to make a decision about me.  When can I sign myself out?"  Patient was advised that he will be here overnight and that he is IVC'd.  Informed patient that he would be here overnight.  He states, "well, at least you're being honest.  They lied to me in the ED.  I didn't know I'd be here overnight."  Patient is observed pacing and appears somewhat agitated.  He is laughing and joking with another male peer.  He denies any SI/HI and does not appear to be responding to internal stimuli.  Patient appears to accept the fact that he will stay overnight before a disposition is made.

## 2017-04-16 NOTE — ED Notes (Signed)
Patient states that he was "at [facility] a couple of years ago and they diagnosed with me with some kinda mood disorder, nonspecific."  "I haven't been taking any medications."  Patient has been calm, cooperative; his behavior has been appropriate.  He is less irritable about his stay.

## 2017-04-16 NOTE — ED Provider Notes (Signed)
-----------------------------------------   8:47 PM on 04/16/2017 -----------------------------------------  Patient evaluated by tele-psych, IVC has been rescinded, and patient cleared for discharge.   Dionne Bucy, MD 04/17/17 [phone]

## 2017-04-16 NOTE — ED Provider Notes (Signed)
[facility] Emergency Department Provider Note   ____________________________________________   I have reviewed the triage vital signs and the nursing notes.   HISTORY  Chief Complaint IVC   History limited by: Not Limited   HPI Kevin PEERSON Curry is a 31 y.o. male who presents to the emergency department today under IVC. Per IVC paperwork the patient has been acting abnormally, with violent behavior and stating that he would be better off dead. IVC paperwork states the patient has not been taking his bipolar medications. The patient himself states that he is not supposed to be on any medications. He states he got in an argument with his girlfriend who called the police. The patient denies any SI or HI at this time.   Past Medical History:  Diagnosis Date  . Bipolar 1 disorder (HCC)     There are no active problems to display for this patient.   History reviewed. No pertinent surgical history.  Prior to Admission medications   Medication Sig Start Date End Date Taking? Authorizing Provider  ibuprofen (ADVIL,MOTRIN) 800 MG tablet Take 1 tablet (800 mg total) by mouth every 8 (eight) hours as needed. 01/26/15   Beers, Charmayne Sheer, PA-C  naproxen (NAPROSYN) 500 MG tablet Take 1 tablet (500 mg total) by mouth 2 (two) times daily with a meal. 04/10/16   Jene Every, MD  oxyCODONE-acetaminophen (ROXICET) 5-325 MG per tablet Take 1-2 tablets by mouth every 4 (four) hours as needed for severe pain. 01/26/15   Beers, Charmayne Sheer, PA-C    Allergies Codeine  Family History  Problem Relation Age of Onset  . Heart disease Father     Social History Social History  Substance Use Topics  . Smoking status: Current Every Day Smoker    Packs/day: 0.50    Years: 5.00    Types: Cigarettes  . Smokeless tobacco: Never Used  . Alcohol use Yes     Comment: occasionaly    Review of Systems Constitutional: No fever/chills Eyes: No visual changes. ENT: No sore  throat. Cardiovascular: Denies chest pain. Respiratory: Denies shortness of breath. Gastrointestinal: No abdominal pain.  No nausea, no vomiting.  No diarrhea.   Genitourinary: Negative for dysuria. Musculoskeletal: Negative for back pain. Skin: Negative for rash. Neurological: Negative for headaches, focal weakness or numbness.  ____________________________________________   PHYSICAL EXAM:  VITAL SIGNS: ED Triage Vitals  Enc Vitals Group     BP 04/16/17 1329 (!) 143/85     Pulse Rate 04/16/17 1329 72     Resp 04/16/17 1329 16     Temp 04/16/17 1329 98.8 F (37.1 C)     Temp Source 04/16/17 1329 Oral     SpO2 04/16/17 1329 98 %     Weight 04/16/17 1325 187 lb (84.8 kg)   Constitutional: Alert and oriented. Well appearing and in no distress. Eyes: Conjunctivae are normal.  ENT   Head: Normocephalic and atraumatic.   Nose: No congestion/rhinnorhea.   Mouth/Throat: Mucous membranes are moist.   Neck: No stridor. Hematological/Lymphatic/Immunilogical: No cervical lymphadenopathy. Cardiovascular: Normal rate, regular rhythm.  No murmurs, rubs, or gallops.  Respiratory: Normal respiratory effort without tachypnea nor retractions. Breath sounds are clear and equal bilaterally. No wheezes/rales/rhonchi. Gastrointestinal: Soft and non tender. No rebound. No guarding.  Genitourinary: Deferred Musculoskeletal: Normal range of motion in all extremities. No lower extremity edema. Neurologic:  Normal speech and language. No gross focal neurologic deficits are appreciated.  Skin:  Skin is warm, dry and intact. No  rash noted. Psychiatric: Mood and affect are normal. Speech and behavior are normal. Patient exhibits appropriate insight and judgment.  ____________________________________________    LABS (pertinent positives/negatives)  Labs Reviewed  COMPREHENSIVE METABOLIC PANEL - Abnormal; Notable for the following:       Result Value   Glucose, Bld 107 (*)    All other  components within normal limits  CBC - Abnormal; Notable for the following:    WBC 11.7 (*)    All other components within normal limits  URINE DRUG SCREEN, QUALITATIVE ([facility] ONLY) - Abnormal; Notable for the following:    Cocaine Metabolite,Ur [place] POSITIVE (*)    Cannabinoid 50 Ng, Ur [place] POSITIVE (*)    All other components within normal limits  ETHANOL     ____________________________________________   EKG  None  ____________________________________________    RADIOLOGY  None  ____________________________________________   PROCEDURES  Procedures  ____________________________________________   INITIAL IMPRESSION / ASSESSMENT AND PLAN / ED COURSE  Pertinent labs & imaging results that were available during my care of the patient were reviewed by me and considered in my medical decision making (see chart for details).  Patient presents to the emergency department today under IVC. Patient states he does have a history of being at [facility]. It is unclear at this time what his diagnosis was. However per the IVC paperwork has not been taking his medications. I examined he was, however given history to feel patient should have a evaluation by psychiatry.  ____________________________________________   FINAL CLINICAL IMPRESSION(S) / ED DIAGNOSES  Abnormal behavior  Note: This dictation was prepared with Dragon dictation. Any transcriptional errors that result from this process are unintentional     Phineas Semen, MD 04/16/17 [phone]

## 2017-04-16 NOTE — BH Assessment (Signed)
Assessment Note  Kevin Curry is an 31 y.o. male Who presents to the ER under IVC. Patient states he and his wife had an argument and she got upset and called 911. He states, the wife reported he was having SI but it's untrue. Per his report, the wife suffered from a stroke approximately three years ago and since then her behaviors have been erratic and unpredictable.  Patient states he do not want to end his life. "My (legal) charges just got dropped; I got a car, new job and things going good. My life is finally good; I don't want to die." Patient further reports, approximately 10 years ago, he and his ex-wife ended relationship."  Patient admits to using cocaine, alcohol and cannabis.  During the interview, the patient was calm, cooperative and pleasant.  He was able to provide appropriate answers to the questions. He denies SI/HI and AV/H.    Diagnosis: Bipolar (Per History)  Past Medical History:  Past Medical History:  Diagnosis Date  . Bipolar 1 disorder (HCC)     History reviewed. No pertinent surgical history.  Family History:  Family History  Problem Relation Age of Onset  . Heart disease Father     Social History:  reports that he has been smoking Cigarettes.  He has a 2.50 pack-year smoking history. He has never used smokeless tobacco. He reports that he drinks alcohol. He reports that he uses drugs, including Marijuana and Cocaine.  Additional Social History:  Alcohol / Drug Use Pain Medications: See PTA Prescriptions: See PTA Over the Counter: See PTA History of alcohol / drug use?: Yes Longest period of sobriety (when/how long): Unable to quantify Negative Consequences of Use: Financial Withdrawal Symptoms:  (Reports of none) Substance #1 Name of Substance 1: Cannabis Substance #2 Name of Substance 2: Alcohol  Substance #3 Name of Substance 3: Cocaine  CIWA: CIWA-Ar BP: (!) 143/85 Pulse Rate: 72 COWS:    Allergies:  Allergies  Allergen Reactions   . Codeine Swelling    Home Medications:  (Not in a hospital admission)  OB/GYN Status:  No LMP for male patient.  General Assessment Data Assessment unable to be completed: Yes Location of Assessment: [facility] ED TTS Assessment: In system Is this a Tele or Face-to-Face Assessment?: Face-to-Face Is this an Initial Assessment or a Re-assessment for this encounter?: Initial Assessment Marital status: Married [place] name: n/a Is patient pregnant?: No Pregnancy Status: No Living Arrangements: Spouse/significant other Can pt return to current living arrangement?: Yes Admission Status: Involuntary Is patient capable of signing voluntary admission?: No (Under IVC) Referral Source: Self/Family/Friend Insurance type: None  Medical Screening Exam [facility] Walk-in ONLY) Medical Exam completed: Yes  Crisis Care Plan Living Arrangements: Spouse/significant other Legal Guardian: Other: (Self) Name of Psychiatrist: Reports of none Name of Therapist: Reports of none  Education Status Is patient currently in school?: No Current Grade: n/a Highest grade of school patient has completed: n/a Name of school: n/a Contact person: n/a  Risk to self with the past 6 months Suicidal Ideation: No Has patient been a risk to self within the past 6 months prior to admission? : No Suicidal Intent: No Has patient had any suicidal intent within the past 6 months prior to admission? : No Is patient at risk for suicide?: No Suicidal Plan?: No Has patient had any suicidal plan within the past 6 months prior to admission? : No Access to Means: No What has been your use of drugs/alcohol within the last 12  months?: Cannabis, Alcohol & Cocaine Previous Attempts/Gestures: Yes (Approximately 10 years ago) How many times?: 1 Other Self Harm Risks: Active Addiction Triggers for Past Attempts: None known Intentional Self Injurious Behavior: None Family Suicide History: No Recent stressful life event(s): Other  (Comment) Persecutory voices/beliefs?: No Depression: No Depression Symptoms:  (Reports of none) Substance abuse history and/or treatment for substance abuse?: Yes Suicide prevention information given to non-admitted patients: Not applicable  Risk to Others within the past 6 months Homicidal Ideation: No Does patient have any lifetime risk of violence toward others beyond the six months prior to admission? : No Thoughts of Harm to Others: No Current Homicidal Intent: No Current Homicidal Plan: No Access to Homicidal Means: No Identified Victim: Reports of none History of harm to others?: No Assessment of Violence: None Noted Violent Behavior Description: Reports of none Does patient have access to weapons?: No Criminal Charges Pending?: No Does patient have a court date: No Is patient on probation?: No  Psychosis Hallucinations: None noted Delusions: None noted  Mental Status Report Appearance/Hygiene: Unremarkable, In scrubs Eye Contact: Good Motor Activity: Freedom of movement, Unremarkable Speech: Logical/coherent, Unremarkable Level of Consciousness: Alert Mood: Pleasant Affect: Appropriate to circumstance Anxiety Level: None Thought Processes: Coherent, Relevant Judgement: Unimpaired Orientation: Person, Place, Time, Situation, Appropriate for developmental age Obsessive Compulsive Thoughts/Behaviors: Minimal  Cognitive Functioning Concentration: Normal Memory: Recent Intact, Remote Intact IQ: Average Insight: Good Impulse Control: Good Appetite: Good Weight Loss: 0 Weight Gain: 0 Sleep: No Change Total Hours of Sleep: 8 Vegetative Symptoms: None  ADLScreening [facility] Assessment Services) Patient's cognitive ability adequate to safely complete daily activities?: Yes Patient able to express need for assistance with ADLs?: Yes Independently performs ADLs?: Yes (appropriate for developmental age)  Prior Inpatient Therapy Prior Inpatient Therapy: Yes Prior  Therapy Dates: 2014 Prior Therapy Facilty/Provider(s): [facility]  Reason for Treatment: Depression  Prior Outpatient Therapy Prior Outpatient Therapy: No Prior Therapy Dates: Reports of none Prior Therapy Facilty/Provider(s): Reports of none Reason for Treatment: Reports of none Does patient have an ACCT team?: No Does patient have Intensive In-House Services?  : No Does patient have [facility] services? : No Does patient have P4CC services?: No  ADL Screening (condition at time of admission) Patient's cognitive ability adequate to safely complete daily activities?: Yes Is the patient deaf or have difficulty hearing?: No Does the patient have difficulty seeing, even when wearing glasses/contacts?: No Does the patient have difficulty concentrating, remembering, or making decisions?: No Patient able to express need for assistance with ADLs?: Yes Does the patient have difficulty dressing or bathing?: No Independently performs ADLs?: Yes (appropriate for developmental age) Does the patient have difficulty walking or climbing stairs?: No Weakness of Legs: None Weakness of Arms/Hands: None  Home Assistive Devices/Equipment Home Assistive Devices/Equipment: None  Therapy Consults (therapy consults require a physician order) PT Evaluation Needed: No OT Evalulation Needed: No SLP Evaluation Needed: No Abuse/Neglect Assessment (Assessment to be complete while patient is alone) Physical Abuse: Denies Verbal Abuse: Denies Sexual Abuse: Denies Exploitation of patient/patient's resources: Denies Self-Neglect: Denies Values / Beliefs Cultural Requests During Hospitalization: None Spiritual Requests During Hospitalization: None Consults Spiritual Care Consult Needed: No Social Work Consult Needed: No Merchant navy officer (For Healthcare) Does Patient Have a Medical Advance Directive?: No Would patient like information on creating a medical advance directive?: No - Patient declined     Additional Information 1:1 In Past 12 Months?: No CIRT Risk: No Elopement Risk: No Does patient have medical clearance?: Yes  Child/Adolescent Assessment  Running Away Risk: Denies (Patient is an adult)  Disposition:  Disposition Initial Assessment Completed for this Encounter: Yes Disposition of Patient: Other dispositions (ER MD Ordered Psych Consult)  On Site Evaluation by:   Reviewed with Physician:    Lilyan Gilford MS, LCAS, LPC, NCC, CCSI Therapeutic Triage Specialist 04/16/2017 7:15 PM

## 2017-04-16 NOTE — ED Notes (Signed)
Report was received from Vesta Mixer., RN; Pt. Verbalizes no complaints or distress; denies S.I./Hi. Continue to monitor with 15 min. Monitoring.

## 2017-04-16 NOTE — ED Notes (Signed)
EDP okay with pt moving to [facility]. Report given to [facility] nurse.

## 2017-06-05 DIAGNOSIS — F17210 Nicotine dependence, cigarettes, uncomplicated: Secondary | ICD-10-CM | POA: Insufficient documentation

## 2017-06-05 DIAGNOSIS — J029 Acute pharyngitis, unspecified: Secondary | ICD-10-CM | POA: Insufficient documentation

## 2017-06-06 ENCOUNTER — Encounter: Payer: Self-pay | Admitting: *Deleted

## 2017-06-06 ENCOUNTER — Emergency Department
Admission: EM | Admit: 2017-06-06 | Discharge: 2017-06-06 | Disposition: A | Discharge status: Home / Self Care | Payer: Self-pay | Attending: Emergency Medicine | Admitting: Emergency Medicine

## 2017-06-06 DIAGNOSIS — J029 Acute pharyngitis, unspecified: Secondary | ICD-10-CM

## 2017-06-06 MED ORDER — LIDOCAINE VISCOUS 2 % MT SOLN
15.0000 mL | Freq: Once | OROMUCOSAL | Status: AC
  Administered 2017-06-06: 15 mL via OROMUCOSAL

## 2017-06-06 MED ORDER — LIDOCAINE VISCOUS 2 % MT SOLN
OROMUCOSAL | Status: AC
  Filled 2017-06-06: qty 15

## 2017-06-06 NOTE — ED Triage Notes (Signed)
Pt took unknown abx at 2100 tonight for a toothache.  Now, pt reports his throat hurts and it feels like it is ripping.  No resp distress   No rash noted.  No swelling of tongue.   Pt alert  Speech clear.

## 2017-06-06 NOTE — ED Provider Notes (Signed)
[facility] Emergency Department Provider Note   ____________________________________________    I have reviewed the triage vital signs and the nursing notes.   HISTORY  Chief Complaint Allergic Reaction     HPI CRIMSON BEER II is a 31 y.o. male Who presents with complaints of sore throat.patient reports he got a pill from a friend that he was told was an antibiotic which he took because he was having dental pain. Today he notes his throat is sore on the right side. No difficulty swallowing. No difficulty breathing. No intraoral swelling no rash.   Past Medical History:  Diagnosis Date  . Bipolar 1 disorder (HCC)     There are no active problems to display for this patient.   No past surgical history on file.  Prior to Admission medications   Medication Sig Start Date End Date Taking? Authorizing Provider  ibuprofen (ADVIL,MOTRIN) 800 MG tablet Take 1 tablet (800 mg total) by mouth every 8 (eight) hours as needed. Patient not taking: Reported on 04/16/2017 01/26/15   Beers, Charmayne Sheer, PA-C  naproxen (NAPROSYN) 500 MG tablet Take 1 tablet (500 mg total) by mouth 2 (two) times daily with a meal. Patient not taking: Reported on 04/16/2017 04/10/16   Jene Every, MD  oxyCODONE-acetaminophen (ROXICET) 5-325 MG per tablet Take 1-2 tablets by mouth every 4 (four) hours as needed for severe pain. Patient not taking: Reported on 04/16/2017 01/26/15   Evangeline Dakin, PA-C     Allergies Codeine  Family History  Problem Relation Age of Onset  . Heart disease Father     Social History Social History  Substance Use Topics  . Smoking status: Current Every Day Smoker    Packs/day: 0.50    Years: 5.00    Types: Cigarettes  . Smokeless tobacco: Never Used  . Alcohol use Yes     Comment: occasionaly    Review of Systems  Constitutional: No fever/chills  ZOX:WRUE sore throat as above   Gastrointestinal:No nausea, no vomiting.      Skin: Negative for rash. Neurological: Negative for headaches     ____________________________________________   PHYSICAL EXAM:  VITAL SIGNS: ED Triage Vitals  Enc Vitals Group     BP 06/06/17 0013 140/89     Pulse Rate 06/06/17 0013 60     Resp 06/06/17 0013 14     Temp 06/06/17 0013 98.8 F (37.1 C)     Temp Source 06/06/17 0013 Oral     SpO2 06/06/17 0013 99 %     Weight 06/06/17 0014 81.6 kg (180 lb)     Height 06/06/17 0014 1.854 m ( )     Head Circumference --      Peak Flow --      Pain Score 06/06/17 0016 10     Pain Loc --      Pain Edu? --      Excl. in GC? --     Constitutional: Alert and oriented. No acute distress Eyes: Conjunctivae are normal.   Nose: No congestion/rhinnorhea. Mouth/Throat: Mucous membranes are moist.  pharynx is normal Cardiovascular: Normal rate, regular rhythm.  Respiratory: Normal respiratory effort.  No retractions. Genitourinary: deferred  Neurologic:  Normal speech and language. No gross focal neurologic deficits are appreciated.   Skin:  Skin is warm, dry and intact. No rash noted.   ____________________________________________   LABS (all labs ordered are listed, but only abnormal results are displayed)  Labs Reviewed - No data to  display ____________________________________________  EKG   ____________________________________________  RADIOLOGY  None ____________________________________________   PROCEDURES  Procedure(s) performed:No    Critical Care performed: No ____________________________________________   INITIAL IMPRESSION / ASSESSMENT AND PLAN / ED COURSE  Pertinent labs & imaging results that were available during my care of the patient were reviewed by me and considered in my medical decision making (see chart for details).  patient well-appearing and in no acute distress. Pharyngeal exam is normal. No swelling, no intraoral abnormalities. No dental abscess. No facial swelling. No  neck swelling. No evidence of allergic reaction.Recommend supportive care, outpatient follow-up as needed. Return precautions discussed   ____________________________________________   FINAL CLINICAL IMPRESSION(S) / ED DIAGNOSES  Final diagnoses:  Pharyngitis, unspecified etiology      NEW MEDICATIONS STARTED DURING THIS VISIT:  Discharge Medication List as of 06/06/2017 12:51 AM       Note:  This document was prepared using Dragon voice recognition software and may include unintentional dictation errors.    Jene Every, MD 06/06/17 [phone]

## 2018-02-25 ENCOUNTER — Other Ambulatory Visit: Payer: Self-pay

## 2018-02-25 ENCOUNTER — Emergency Department: Payer: Self-pay

## 2018-02-25 ENCOUNTER — Emergency Department
Admission: EM | Admit: 2018-02-25 | Discharge: 2018-02-25 | Disposition: A | Discharge status: Home / Self Care | Payer: Self-pay | Attending: Emergency Medicine | Admitting: Emergency Medicine

## 2018-02-25 DIAGNOSIS — W01198A Fall on same level from slipping, tripping and stumbling with subsequent striking against other object, initial encounter: Secondary | ICD-10-CM | POA: Insufficient documentation

## 2018-02-25 DIAGNOSIS — F17210 Nicotine dependence, cigarettes, uncomplicated: Secondary | ICD-10-CM | POA: Insufficient documentation

## 2018-02-25 DIAGNOSIS — Y92830 Public park as the place of occurrence of the external cause: Secondary | ICD-10-CM | POA: Insufficient documentation

## 2018-02-25 DIAGNOSIS — R0781 Pleurodynia: Secondary | ICD-10-CM

## 2018-02-25 DIAGNOSIS — S20312A Abrasion of left front wall of thorax, initial encounter: Secondary | ICD-10-CM | POA: Insufficient documentation

## 2018-02-25 DIAGNOSIS — Z79899 Other long term (current) drug therapy: Secondary | ICD-10-CM | POA: Insufficient documentation

## 2018-02-25 DIAGNOSIS — T148XXA Other injury of unspecified body region, initial encounter: Secondary | ICD-10-CM

## 2018-02-25 DIAGNOSIS — Z23 Encounter for immunization: Secondary | ICD-10-CM | POA: Insufficient documentation

## 2018-02-25 DIAGNOSIS — Y998 Other external cause status: Secondary | ICD-10-CM | POA: Insufficient documentation

## 2018-02-25 DIAGNOSIS — W19XXXA Unspecified fall, initial encounter: Secondary | ICD-10-CM

## 2018-02-25 DIAGNOSIS — M25512 Pain in left shoulder: Secondary | ICD-10-CM | POA: Insufficient documentation

## 2018-02-25 DIAGNOSIS — Y9301 Activity, walking, marching and hiking: Secondary | ICD-10-CM | POA: Insufficient documentation

## 2018-02-25 MED ORDER — TETANUS-DIPHTH-ACELL PERTUSSIS 5-2.5-18.5 LF-MCG/0.5 IM SUSP
0.5000 mL | Freq: Once | INTRAMUSCULAR | Status: AC
  Administered 2018-02-25: 0.5 mL via INTRAMUSCULAR
  Filled 2018-02-25: qty 0.5

## 2018-02-25 MED ORDER — BACLOFEN 10 MG PO TABS: 10.0000 mg | ORAL_TABLET | Freq: Every day | ORAL | 0 refills | Status: AC

## 2018-02-25 MED ORDER — TRAMADOL HCL 50 MG PO TABS: 50.0000 mg | ORAL_TABLET | Freq: Four times a day (QID) | ORAL | 0 refills | Status: AC | PRN

## 2018-02-25 MED ORDER — MELOXICAM 15 MG PO TABS: 15.0000 mg | ORAL_TABLET | Freq: Every day | ORAL | 0 refills | Status: AC

## 2018-02-25 NOTE — ED Triage Notes (Signed)
Pt states [place] fell at park. States hit face, L shoulder, and L abd/chest/rib cage. States fell on rocks. Denies LOC. Denies blood thinner use. Abrasions noted to chest/abd/rib cage and 1 noted to face. Alert, oriented, ambulatory.

## 2018-02-25 NOTE — ED Notes (Signed)
See triage note  States he fell couple of days ago    Abrasions noted to abd and left shoulder  Having pain to left shoulder and chest  Pain to chest is increased with movement and inspiration

## 2018-02-25 NOTE — ED Provider Notes (Signed)
[facility] Emergency Department Provider Note  ____________________________________________  Time seen: Approximately 10:08 AM  I have reviewed the triage vital signs and the nursing notes.   HISTORY  Chief Complaint Fall    HPI Kevin Curry is a 32 y.o. male that presents to the emergency department for evaluation of left shoulder and rib pain after fall 2 days ago.  [place] the [place], tripped on a rock and fell on an irrigation system.  He did not lose consciousness.  He does not take any blood thinners.  He has a small abrasion to his cheek but states that it does not hurt.  His shoulder and rib cage hurt the worst. He has abrasions to his left ribs.  He had difficulty turning over in the middle of the night last night.  His partner had to help him stand up this morning due to pain.  Pain is worse with movement and deep breathing.  No headache, confusion, dizziness, neck pain, numbness, tingling.  Past Medical History:  Diagnosis Date  . Bipolar 1 disorder (HCC)     There are no active problems to display for this patient.   History reviewed. No pertinent surgical history.  Prior to Admission medications   Medication Sig Start Date End Date Taking? Authorizing Provider  baclofen (LIORESAL) 10 MG tablet Take 1 tablet (10 mg total) by mouth daily for 7 days. 02/25/18 03/04/18  Enid Derry, PA-C  ibuprofen (ADVIL,MOTRIN) 800 MG tablet Take 1 tablet (800 mg total) by mouth every 8 (eight) hours as needed. Patient not taking: Reported on 04/16/2017 01/26/15   Beers, Charmayne Sheer, PA-C  meloxicam (MOBIC) 15 MG tablet Take 1 tablet (15 mg total) by mouth daily for 10 days. 02/25/18 03/07/18  Enid Derry, PA-C  naproxen (NAPROSYN) 500 MG tablet Take 1 tablet (500 mg total) by mouth 2 (two) times daily with a meal. Patient not taking: Reported on 04/16/2017 04/10/16   Jene Every, MD  oxyCODONE-acetaminophen (ROXICET) 5-325 MG per tablet Take 1-2  tablets by mouth every 4 (four) hours as needed for severe pain. Patient not taking: Reported on 04/16/2017 01/26/15   Beers, Charmayne Sheer, PA-C  traMADol (ULTRAM) 50 MG tablet Take 1 tablet (50 mg total) by mouth every 6 (six) hours as needed. 02/25/18 02/25/19  Enid Derry, PA-C    Allergies Codeine  Family History  Problem Relation Age of Onset  . Heart disease Father     Social History Social History   Tobacco Use  . Smoking status: Current Every Day Smoker    Packs/day: 0.50    Years: 5.00    Pack years: 2.50    Types: Cigarettes  . Smokeless tobacco: Never Used  Substance Use Topics  . Alcohol use: Yes    Comment: occasionaly  . Drug use: Yes    Types: Marijuana, Cocaine     Review of Systems  Respiratory:  No SOB. Gastrointestinal: No abdominal pain.  No nausea, no vomiting.  Musculoskeletal: Positive for shoulder and rib pain.  Skin: Negative for rash, lacerations, ecchymosis. Positive for abrasion and rash.  Neurological: Negative for headaches, numbness or tingling   ____________________________________________   PHYSICAL EXAM:  VITAL SIGNS: ED Triage Vitals  Enc Vitals Group     BP 02/25/18 0914 132/69     Pulse Rate 02/25/18 0914 62     Resp 02/25/18 0914 15     Temp 02/25/18 0914 98.1 F (36.7 C)     Temp Source  02/25/18 0914 Oral     SpO2 02/25/18 0914 99 %     Weight 02/25/18 0916 200 lb (90.7 kg)     Height 02/25/18 0916 6\' 1"  (1.854 m)     Head Circumference --      Peak Flow --      Pain Score 02/25/18 0923 8     Pain Loc --      Pain Edu? --      Excl. in GC? --      Constitutional: Alert and oriented. Well appearing and in no acute distress. Eyes: Conjunctivae are normal. PERRL. EOMI. Head: Atraumatic. ENT:      Ears:      Nose: No congestion/rhinnorhea.      Mouth/Throat: Mucous membranes are moist.  Neck: No stridor.  No cervical spine tenderness to palpation. Cardiovascular: Normal rate, regular rhythm.  Good peripheral  circulation. Respiratory: Normal respiratory effort without tachypnea or retractions. Lungs CTAB. Good air entry to the bases with no decreased or absent breath sounds. Gastrointestinal:  Soft and nontender to palpation. No guarding or rigidity. No palpable masses. No distention.  Musculoskeletal: Full range of motion to all extremities. No gross deformities appreciated.  Normal gait. He is able to push himself up with left arm.  Strength equal in upper extremities bilaterally.  Full range of motion of shoulder. Neurologic:  Normal speech and language. No gross focal neurologic deficits are appreciated.  Skin:  Skin is warm, dry and intact.  3 inch friction burn and abrasion to left rib cage. Psychiatric: Mood and affect are normal. Speech and behavior are normal. Patient exhibits appropriate insight and judgement.   ____________________________________________   LABS (all labs ordered are listed, but only abnormal results are displayed)  Labs Reviewed - No data to display ____________________________________________  EKG   ____________________________________________  RADIOLOGY Lexine Baton, personally viewed and evaluated these images (plain radiographs) as part of my medical decision making, as well as reviewing the written report by the radiologist.  Dg Ribs Unilateral W/chest Left  Result Date: 02/25/2018 CLINICAL DATA:  Pain after fall EXAM: LEFT RIBS AND CHEST - 3+ VIEW COMPARISON:  Chest x-ray August 04, 2016 FINDINGS: No fracture or other bone lesions are seen involving the ribs. There is no evidence of pneumothorax or pleural effusion. Both lungs are clear. Heart size and mediastinal contours are within normal limits. IMPRESSION: Negative. Electronically Signed   By: Gerome Sam III M.D   On: 02/25/2018 11:29   Dg Shoulder Left  Result Date: 02/25/2018 CLINICAL DATA:  Pain after fall EXAM: LEFT SHOULDER - 2+ VIEW COMPARISON:  None. FINDINGS: There is no evidence of  fracture or dislocation. There is no evidence of arthropathy or other focal bone abnormality. Soft tissues are unremarkable. IMPRESSION: Negative. Electronically Signed   By: Gerome Sam III M.D   On: 02/25/2018 11:28    ____________________________________________    PROCEDURES  Procedure(s) performed:    Procedures    Medications  Tdap (BOOSTRIX) injection 0.5 mL (0.5 mLs Intramuscular Given 02/25/18 1041)     ____________________________________________   INITIAL IMPRESSION / ASSESSMENT AND PLAN / ED COURSE  Pertinent labs & imaging results that were available during my care of the patient were reviewed by me and considered in my medical decision making (see chart for details).  Review of the [place] CSRS was performed in accordance of the [facility] prior to dispensing any controlled drugs.   Patient presented to the emergency department for evaluation of shoulder and  rib pain after fall 2 days ago.  Vital signs and exam are reassuring.  Shoulder and rib x-rays are negative for acute abnormalities.  Findings were discussed with patient.  Patient will be discharged home with prescriptions for mobic, baclofen, a short course of tramadol. Patient is to follow up with PCP as directed. Patient is given ED precautions to return to the ED for any worsening or new symptoms.     ____________________________________________  FINAL CLINICAL IMPRESSION(S) / ED DIAGNOSES  Final diagnoses:  Fall, initial encounter  Abrasion  Acute pain of left shoulder  Rib pain      NEW MEDICATIONS STARTED DURING THIS VISIT:  ED Discharge Orders        Ordered    meloxicam (MOBIC) 15 MG tablet  Daily     02/25/18 1159    baclofen (LIORESAL) 10 MG tablet  Daily     02/25/18 1159    traMADol (ULTRAM) 50 MG tablet  Every 6 hours PRN     02/25/18 1159          This chart was dictated using voice recognition software/Dragon. Despite best efforts to proofread, errors can occur which can  change the meaning. Any change was purely unintentional.    Enid DerryWagner, Keyvin Rison, PA-C 02/25/18 1525    Sharman CheekStafford, Phillip, MD 02/25/18 2203

## 2020-03-29 ENCOUNTER — Emergency Department
Admission: EM | Admit: 2020-03-29 | Discharge: 2020-03-29 | Disposition: A | Discharge status: Home / Self Care | Payer: Self-pay | Attending: Emergency Medicine | Admitting: Emergency Medicine

## 2020-03-29 ENCOUNTER — Other Ambulatory Visit: Payer: Self-pay

## 2020-03-29 DIAGNOSIS — Z5321 Procedure and treatment not carried out due to patient leaving prior to being seen by health care provider: Secondary | ICD-10-CM | POA: Insufficient documentation

## 2020-03-29 DIAGNOSIS — M79601 Pain in right arm: Secondary | ICD-10-CM | POA: Insufficient documentation

## 2020-03-29 NOTE — ED Triage Notes (Signed)
Patient reports right arm pain, denies any type of injury, ? Insect bite.

## 2020-03-30 ENCOUNTER — Encounter: Payer: Self-pay | Admitting: Emergency Medicine

## 2020-03-30 ENCOUNTER — Other Ambulatory Visit: Payer: Self-pay

## 2020-03-30 ENCOUNTER — Emergency Department: Payer: Self-pay

## 2020-03-30 ENCOUNTER — Inpatient Hospital Stay
Admission: EM | Admit: 2020-03-30 | Discharge: 2020-03-31 | DRG: 603 | Disposition: A | Discharge status: Home / Self Care | Payer: Self-pay | Attending: Internal Medicine | Admitting: Internal Medicine

## 2020-03-30 DIAGNOSIS — Z79899 Other long term (current) drug therapy: Secondary | ICD-10-CM

## 2020-03-30 DIAGNOSIS — R609 Edema, unspecified: Secondary | ICD-10-CM

## 2020-03-30 DIAGNOSIS — L03113 Cellulitis of right upper limb: Principal | ICD-10-CM | POA: Diagnosis present

## 2020-03-30 DIAGNOSIS — A419 Sepsis, unspecified organism: Secondary | ICD-10-CM

## 2020-03-30 DIAGNOSIS — F319 Bipolar disorder, unspecified: Secondary | ICD-10-CM | POA: Diagnosis present

## 2020-03-30 DIAGNOSIS — F1290 Cannabis use, unspecified, uncomplicated: Secondary | ICD-10-CM | POA: Diagnosis present

## 2020-03-30 DIAGNOSIS — F1490 Cocaine use, unspecified, uncomplicated: Secondary | ICD-10-CM | POA: Diagnosis present

## 2020-03-30 DIAGNOSIS — Z7289 Other problems related to lifestyle: Secondary | ICD-10-CM

## 2020-03-30 DIAGNOSIS — Z20822 Contact with and (suspected) exposure to covid-19: Secondary | ICD-10-CM | POA: Diagnosis present

## 2020-03-30 DIAGNOSIS — Z8249 Family history of ischemic heart disease and other diseases of the circulatory system: Secondary | ICD-10-CM

## 2020-03-30 DIAGNOSIS — Z885 Allergy status to narcotic agent status: Secondary | ICD-10-CM

## 2020-03-30 DIAGNOSIS — L98491 Non-pressure chronic ulcer of skin of other sites limited to breakdown of skin: Secondary | ICD-10-CM | POA: Diagnosis present

## 2020-03-30 DIAGNOSIS — F17210 Nicotine dependence, cigarettes, uncomplicated: Secondary | ICD-10-CM | POA: Diagnosis present

## 2020-03-30 LAB — COMPREHENSIVE METABOLIC PANEL
ALT: 12 U/L (ref 0–44)
AST: 17 U/L (ref 15–41)
Albumin: 4.1 g/dL (ref 3.5–5.0)
Alkaline Phosphatase: 59 U/L (ref 38–126)
Anion gap: 10 (ref 5–15)
BUN: 11 mg/dL (ref 6–20)
CO2: 24 mmol/L (ref 22–32)
Calcium: 9 mg/dL (ref 8.9–10.3)
Chloride: 103 mmol/L (ref 98–111)
Creatinine, Ser: 0.83 mg/dL (ref 0.61–1.24)
GFR calc Af Amer: >60 mL/min (ref >60)
GFR calc non Af Amer: >60 mL/min (ref >60)
Glucose, Bld: 111 mg/dL — ABNORMAL HIGH (ref 70–99)
Potassium: 3.5 mmol/L (ref 3.5–5.1)
Sodium: 137 mmol/L (ref 135–145)
Total Bilirubin: 0.8 mg/dL (ref 0.3–1.2)
Total Protein: 7.4 g/dL (ref 6.5–8.1)

## 2020-03-30 LAB — CBC WITH DIFFERENTIAL/PLATELET
Abs Immature Granulocytes: 0.09 10*3/uL — ABNORMAL HIGH (ref 0.00–0.07)
Basophils Absolute: 0.1 10*3/uL (ref 0.0–0.1)
Basophils Relative: 0 %
Eosinophils Absolute: 0.1 10*3/uL (ref 0.0–0.5)
Eosinophils Relative: 1 %
HCT: 35.1 % — ABNORMAL LOW (ref 39.0–52.0)
Hemoglobin: 12.2 g/dL — ABNORMAL LOW (ref 13.0–17.0)
Immature Granulocytes: 1 %
Lymphocytes Relative: 8 %
Lymphs Abs: 1.5 10*3/uL (ref 0.7–4.0)
MCH: 30.1 pg (ref 26.0–34.0)
MCHC: 34.8 g/dL (ref 30.0–36.0)
MCV: 86.7 fL (ref 80.0–100.0)
Monocytes Absolute: 1.6 10*3/uL — ABNORMAL HIGH (ref 0.1–1.0)
Monocytes Relative: 9 %
Neutro Abs: 14.9 10*3/uL — ABNORMAL HIGH (ref 1.7–7.7)
Neutrophils Relative %: 81 %
Platelets: 275 10*3/uL (ref 150–400)
RBC: 4.05 MIL/uL — ABNORMAL LOW (ref 4.22–5.81)
RDW: 12.2 % (ref 11.5–15.5)
WBC: 18.2 10*3/uL — ABNORMAL HIGH (ref 4.0–10.5)
nRBC: 0 % (ref 0.0–0.2)

## 2020-03-30 LAB — LACTIC ACID, PLASMA: Lactic Acid, Venous: 1.1 mmol/L (ref 0.5–1.9)

## 2020-03-30 NOTE — ED Triage Notes (Signed)
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

## 2020-03-31 ENCOUNTER — Encounter: Payer: Self-pay | Admitting: Internal Medicine

## 2020-03-31 DIAGNOSIS — L03113 Cellulitis of right upper limb: Secondary | ICD-10-CM | POA: Diagnosis present

## 2020-03-31 LAB — SARS CORONAVIRUS 2 BY RT PCR (HOSPITAL ORDER, PERFORMED IN ~~LOC~~ HOSPITAL LAB): SARS Coronavirus 2: NEGATIVE

## 2020-03-31 LAB — HIV ANTIBODY (ROUTINE TESTING W REFLEX): HIV Screen 4th Generation wRfx: NONREACTIVE

## 2020-03-31 LAB — PROCALCITONIN: Procalcitonin: <0.1 ng/mL

## 2020-03-31 MED ORDER — DEXAMETHASONE SODIUM PHOSPHATE 10 MG/ML IJ SOLN
INTRAMUSCULAR | Status: AC
  Filled 2020-03-31: qty 1

## 2020-03-31 MED ORDER — DEXAMETHASONE SODIUM PHOSPHATE 10 MG/ML IJ SOLN
10.0000 mg | Freq: Once | INTRAMUSCULAR | Status: AC
  Administered 2020-03-31: 10 mg via INTRAVENOUS
  Filled 2020-03-31: qty 1

## 2020-03-31 MED ORDER — LACTATED RINGERS IV BOLUS (SEPSIS): 1000.0000 mL | Freq: Once | INTRAVENOUS | Status: DC

## 2020-03-31 MED ORDER — ONDANSETRON HCL 4 MG/2ML IJ SOLN
4.0000 mg | INTRAMUSCULAR | Status: AC
  Administered 2020-03-31: 4 mg via INTRAVENOUS
  Filled 2020-03-31: qty 2

## 2020-03-31 MED ORDER — VANCOMYCIN HCL IN DEXTROSE 1-5 GM/200ML-% IV SOLN
1000.0000 mg | Freq: Once | INTRAVENOUS | Status: DC
  Filled 2020-03-31: qty 200

## 2020-03-31 MED ORDER — MORPHINE SULFATE (PF) 4 MG/ML IV SOLN
4.0000 mg | Freq: Once | INTRAVENOUS | Status: AC
  Administered 2020-03-31: 4 mg via INTRAVENOUS
  Filled 2020-03-31: qty 1

## 2020-03-31 MED ORDER — IBUPROFEN 600 MG PO TABS
600.0000 mg | ORAL_TABLET | Freq: Four times a day (QID) | ORAL | Status: DC | PRN
  Filled 2020-03-31: qty 1

## 2020-03-31 MED ORDER — LACTATED RINGERS IV BOLUS (SEPSIS)
1000.0000 mL | Freq: Once | INTRAVENOUS | Status: AC
  Administered 2020-03-31: 1000 mL via INTRAVENOUS

## 2020-03-31 MED ORDER — SODIUM CHLORIDE 0.9 % IV SOLN
1.0000 g | INTRAVENOUS | Status: DC
  Filled 2020-03-31: qty 10

## 2020-03-31 MED ORDER — ACETAMINOPHEN 325 MG PO TABS: 650.0000 mg | ORAL_TABLET | Freq: Four times a day (QID) | ORAL | Status: DC | PRN

## 2020-03-31 MED ORDER — SODIUM CHLORIDE 0.9 % IV SOLN: INTRAVENOUS | Status: AC

## 2020-03-31 MED ORDER — SODIUM CHLORIDE 0.9 % IV SOLN
2.0000 g | Freq: Once | INTRAVENOUS | Status: AC
  Administered 2020-03-31: 2 g via INTRAVENOUS
  Filled 2020-03-31: qty 20

## 2020-03-31 MED ORDER — ACETAMINOPHEN 650 MG RE SUPP: 650.0000 mg | Freq: Four times a day (QID) | RECTAL | Status: DC | PRN

## 2020-03-31 MED ORDER — ENOXAPARIN SODIUM 40 MG/0.4ML ~~LOC~~ SOLN: 40.0000 mg | SUBCUTANEOUS | Status: DC

## 2020-03-31 MED ORDER — CLINDAMYCIN HCL 150 MG PO CAPS
150.0000 mg | ORAL_CAPSULE | Freq: Four times a day (QID) | ORAL | 0 refills | Status: AC
Start: 2020-03-31 — End: 2020-04-10

## 2020-03-31 MED ORDER — VANCOMYCIN HCL IN DEXTROSE 1-5 GM/200ML-% IV SOLN
1000.0000 mg | Freq: Three times a day (TID) | INTRAVENOUS | Status: DC
  Administered 2020-03-31: 1000 mg via INTRAVENOUS
  Filled 2020-03-31 (×3): qty 200

## 2020-03-31 MED ORDER — KETOROLAC TROMETHAMINE 15 MG/ML IJ SOLN
15.0000 mg | Freq: Four times a day (QID) | INTRAMUSCULAR | Status: DC
  Administered 2020-03-31: 15 mg via INTRAVENOUS
  Filled 2020-03-31: qty 1

## 2020-03-31 MED ORDER — ACETAMINOPHEN 325 MG PO TABS: 650.0000 mg | ORAL_TABLET | Freq: Four times a day (QID) | ORAL | Status: AC | PRN

## 2020-03-31 MED ORDER — VANCOMYCIN HCL 2000 MG/400ML IV SOLN
2000.0000 mg | Freq: Once | INTRAVENOUS | Status: AC
  Administered 2020-03-31: 2000 mg via INTRAVENOUS
  Filled 2020-03-31: qty 400

## 2020-03-31 NOTE — Discharge Summary (Signed)
Physician Discharge Summary  Kevin Curry ASN:[id number] DOB: 19-Oct-1985 DOA: 03/30/2020  PCP: Patient, No Pcp Per  Admit date: 03/30/2020 Discharge date: 03/31/2020  Admitted From: Home Disposition:  Home  Recommendations for Outpatient Follow-up:  1. Follow up with PCP in 1-2 weeks 2.   Home Health: No Equipment/Devices:None Discharge Condition:Stable CODE STATUS:Full Diet recommendation: Heart Healthy Brief/Interim Summary: HPI: Kevin Curry is a 34 y.o. male with no significant past medical history who presents to the ER with pain and swelling of the right forearm that has gradually worsened over the past several days.  Patient has a wound on the left elbow for the past few days which he states he has been picking at and he noticed pain swelling and redness that started extending downwards towards the hand and now upwards on the inner upper arm .  He denies fever or chills.  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  Initially recommended that the patient stay for additional 24 hours of IV antibiotics.  Patient initially agreed however later on he requested to go home on oral antibiotics as he needed to go back to work.  I had a lengthy discussion with the patient at bedside.  I explained that this is likely suboptimal care as he has significant evidence of soft tissue infection and would likely benefit from continued hospitalization for additional 24 hours for IV antibiotics, following monitoring of cultures, pain control.  He stated that to his own mobility is improved and that should he go home and his symptoms were to worsen that he would come back to the emergency department.  Clear return to ED instructions were provided.  Patient expressed understanding and agreed to do so.  Clindamycin prescribed on discharge.  10-day course.  Literature on cellulitis and clindamycin  provided in discharge packet.  Patient discharged home in stable condition.  Discharge Diagnoses:  Active Problems:   Cellulitis of right arm  Cellulitis of right arm Infected ulcer left elbow Received Rocephin and vancomycin in house.  10 mg of IV Decadron on admission.  15 mg every 6 hours Toradol for pain control.  This proved to be effective.  Patient will be discharged on oral clindamycin.  Clear return to ED instructions were provided at time of discharge.  Patient discharged in stable condition.  Discharge Instructions  Discharge Instructions    Call MD for:  persistant nausea and vomiting   Complete by: As directed    Call MD for:  redness, tenderness, or signs of infection (pain, swelling, redness, odor or green/yellow discharge around incision site)   Complete by: As directed    Call MD for:  severe uncontrolled pain   Complete by: As directed    Call MD for:  temperature >100.4   Complete by: As directed    Diet - low sodium heart healthy   Complete by: As directed    Diet - low sodium heart healthy   Complete by: As directed    Increase activity slowly   Complete by: As directed    Increase activity slowly   Complete by: As directed    No wound care   Complete by: As directed    No wound care   Complete by: As directed      Allergies as of 03/31/2020      Reactions   Codeine Swelling  Medication List    STOP taking these medications   naproxen 500 MG tablet Commonly known as: Naprosyn   oxyCODONE-acetaminophen 5-325 MG tablet Commonly known as: Roxicet     TAKE these medications   acetaminophen 325 MG tablet Commonly known as: TYLENOL Take 2 tablets (650 mg total) by mouth every 6 (six) hours as needed for mild pain (or Fever >/= 101).   clindamycin 150 MG capsule Commonly known as: Cleocin Take 1 capsule (150 mg total) by mouth 4 (four) times daily for 10 days.   ibuprofen 800 MG tablet Commonly known as: ADVIL Take 1 tablet (800 mg total)  by mouth every 8 (eight) hours as needed.       Allergies  Allergen Reactions  . Codeine Swelling    Consultations:  None   Procedures/Studies: DG Elbow Complete Right  Result Date: 03/30/2020 CLINICAL DATA:  Draining ulcer to right elbow with swelling EXAM: RIGHT ELBOW - COMPLETE 3+ VIEW COMPARISON:  None. FINDINGS: No fracture or malalignment. No periostitis or bone destruction. No definitive elbow effusion. Extensive soft tissue swelling. Suspected small ulcer along the dorsal elbow overlying the olecranon. No soft tissue emphysema. IMPRESSION: 1. No acute osseous abnormality. 2. Extensive soft tissue swelling with suspected ulcer along the dorsal elbow overlying the olecranon. No radiographic evidence for osteomyelitis. Electronically Signed   By: Jasmine PangKim  Fujinaga M.D.   On: 03/30/2020 23:16    (Echo, Carotid, EGD, Colonoscopy, ERCP)    Subjective: Patient seen and examined on day of discharge.  Feels well.  Range of motion of right upper extremity and pain have improved.  Discharge Exam: Vitals:   03/31/20 0847 03/31/20 1211  BP: 136/76 (!) 150/75  Pulse: 61 (!) 53  Resp: 19 (!) 24  Temp: 99 F (37.2 C) 98.3 F (36.8 C)  SpO2: 99% 99%   Vitals:   03/31/20 0458 03/31/20 0458 03/31/20 0847 03/31/20 1211  BP: 136/83  136/76 (!) 150/75  Pulse:  65 61 (!) 53  Resp:   19 (!) 24  Temp: 99.2 F (37.3 C)  99 F (37.2 C) 98.3 F (36.8 C)  TempSrc:   Oral Oral  SpO2:  100% 99% 99%  Weight:      Height:        General: Pt is alert, awake, not in acute distress Cardiovascular: RRR, S1/S2 +, no rubs, no gallops Respiratory: CTA bilaterally, no wheezing, no rhonchi Abdominal: Soft, NT, ND, bowel sounds + Extremities: Right elbow swollen, tender to touch, decreased range of motion    The results of significant diagnostics from this hospitalization (including imaging, microbiology, ancillary and laboratory) are listed below for reference.     Microbiology: Recent  Results (from the past 240 hour(s))  Blood culture (routine x 2)     Status: None (Preliminary result)   Collection Time: 03/30/20 10:53 PM   Specimen: Left Antecubital; Blood  Result Value Ref Range Status   Specimen Description LEFT ANTECUBITAL  Final   Special Requests   Final    BOTTLES DRAWN AEROBIC AND ANAEROBIC Blood Culture results may not be optimal due to an excessive volume of blood received in culture bottles   Culture   Final    NO GROWTH < 12 HOURS Performed at [facility], [address] [place], [place] [id number]    Report Status PENDING  Incomplete  Blood culture (routine x 2)     Status: None (Preliminary result)   Collection Time: 03/30/20 10:58 PM   Specimen: Left  Antecubital; Blood  Result Value Ref Range Status   Specimen Description LEFT ANTECUBITAL  Final   Special Requests   Final    BOTTLES DRAWN AEROBIC AND ANAEROBIC Blood Culture results may not be optimal due to an excessive volume of blood received in culture bottles   Culture   Final    NO GROWTH < 12 HOURS Performed at [facility], [address], [place], [place] [address]    Report Status PENDING  Incomplete  SARS Coronavirus 2 by RT PCR (hospital order, performed in [facility] hospital lab) Nasopharyngeal Nasopharyngeal Swab     Status: None   Collection Time: 03/31/20 12:56 AM   Specimen: Nasopharyngeal Swab  Result Value Ref Range Status   SARS Coronavirus 2 NEGATIVE NEGATIVE Final    Comment: (NOTE) SARS-CoV-2 target nucleic acids are NOT DETECTED.  The SARS-CoV-2 RNA is generally detectable in upper and lower respiratory specimens during the acute phase of infection. The lowest concentration of SARS-CoV-2 viral copies this assay can detect is 250 copies / mL. A negative result does not preclude SARS-CoV-2 infection and should not be used as the sole basis for treatment or other patient management decisions.  A negative result may occur with improper specimen  collection / handling, submission of specimen other than nasopharyngeal swab, presence of viral mutation(s) within the areas targeted by this assay, and inadequate number of viral copies (<250 copies / mL). A negative result must be combined with clinical observations, patient history, and epidemiological information.  Fact Sheet for Patients:   BoilerBrush.com.cy  Fact Sheet for Healthcare Providers: https://pope.com/  This test is not yet approved or  cleared by the [place] FDA and has been authorized for detection and/or diagnosis of SARS-CoV-2 by FDA under an Emergency Use Authorization (EUA).  This EUA will remain in effect (meaning this test can be used) for the duration of the COVID-19 declaration under Section 564(b)(1) of the Act, 21 U.S.C. section 360bbb-3(b)(1), unless the authorization is terminated or revoked sooner.  Performed at [facility], [address] [place], [place] [address]      Labs: BNP (last 3 results) No results for input(s): BNP in the last 8760 hours. Basic Metabolic Panel: Recent Labs  Lab 03/30/20 2253  NA 137  K 3.5  CL 103  CO2 24  GLUCOSE 111*  BUN 11  CREATININE 0.83  CALCIUM 9.0   Liver Function Tests: Recent Labs  Lab 03/30/20 2253  AST 17  ALT 12  ALKPHOS 59  BILITOT 0.8  PROT 7.4  ALBUMIN 4.1   No results for input(s): LIPASE, AMYLASE in the last 168 hours. No results for input(s): AMMONIA in the last 168 hours. CBC: Recent Labs  Lab 03/30/20 2253  WBC 18.2*  NEUTROABS 14.9*  HGB 12.2*  HCT 35.1*  MCV 86.7  PLT 275   Cardiac Enzymes: No results for input(s): CKTOTAL, CKMB, CKMBINDEX, TROPONINI in the last 168 hours. BNP: Invalid input(s): POCBNP CBG: No results for input(s): GLUCAP in the last 168 hours. D-Dimer No results for input(s): DDIMER in the last 72 hours. Hgb A1c No results for input(s): HGBA1C in the last 72 hours. Lipid  Profile No results for input(s): CHOL, HDL, LDLCALC, TRIG, CHOLHDL, LDLDIRECT in the last 72 hours. Thyroid function studies No results for input(s): TSH, T4TOTAL, T3FREE, THYROIDAB in the last 72 hours.  Invalid input(s): FREET3 Anemia work up No results for input(s): VITAMINB12, FOLATE, FERRITIN, TIBC, IRON, RETICCTPCT in the last 72 hours. Urinalysis  Component Value Date/Time   COLORURINE Yellow 09/15/2013 0127   APPEARANCEUR Clear 09/15/2013 0127   LABSPEC 1.020 09/15/2013 0127   PHURINE 6.0 09/15/2013 0127   GLUCOSEU Negative 09/15/2013 0127   HGBUR Negative 09/15/2013 0127   BILIRUBINUR Negative 09/15/2013 0127   KETONESUR Negative 09/15/2013 0127   PROTEINUR Negative 09/15/2013 0127   NITRITE Negative 09/15/2013 0127   LEUKOCYTESUR Negative 09/15/2013 0127   Sepsis Labs Invalid input(s): PROCALCITONIN,  WBC,  LACTICIDVEN Microbiology Recent Results (from the past 240 hour(s))  Blood culture (routine x 2)     Status: None (Preliminary result)   Collection Time: 03/30/20 10:53 PM   Specimen: Left Antecubital; Blood  Result Value Ref Range Status   Specimen Description LEFT ANTECUBITAL  Final   Special Requests   Final    BOTTLES DRAWN AEROBIC AND ANAEROBIC Blood Culture results may not be optimal due to an excessive volume of blood received in culture bottles   Culture   Final    NO GROWTH < 12 HOURS Performed at [facility], [address], [place], [place] [address]    Report Status PENDING  Incomplete  Blood culture (routine x 2)     Status: None (Preliminary result)   Collection Time: 03/30/20 10:58 PM   Specimen: Left Antecubital; Blood  Result Value Ref Range Status   Specimen Description LEFT ANTECUBITAL  Final   Special Requests   Final    BOTTLES DRAWN AEROBIC AND ANAEROBIC Blood Culture results may not be optimal due to an excessive volume of blood received in culture bottles   Culture   Final    NO GROWTH < 12 HOURS Performed at [facility], [address] [place], [place] [address]    Report Status PENDING  Incomplete  SARS Coronavirus 2 by RT PCR (hospital order, performed in [facility] hospital lab) Nasopharyngeal Nasopharyngeal Swab     Status: None   Collection Time: 03/31/20 12:56 AM   Specimen: Nasopharyngeal Swab  Result Value Ref Range Status   SARS Coronavirus 2 NEGATIVE NEGATIVE Final    Comment: (NOTE) SARS-CoV-2 target nucleic acids are NOT DETECTED.  The SARS-CoV-2 RNA is generally detectable in upper and lower respiratory specimens during the acute phase of infection. The lowest concentration of SARS-CoV-2 viral copies this assay can detect is 250 copies / mL. A negative result does not preclude SARS-CoV-2 infection and should not be used as the sole basis for treatment or other patient management decisions.  A negative result may occur with improper specimen collection / handling, submission of specimen other than nasopharyngeal swab, presence of viral mutation(s) within the areas targeted by this assay, and inadequate number of viral copies (<250 copies / mL). A negative result must be combined with clinical observations, patient history, and epidemiological information.  Fact Sheet for Patients:   BoilerBrush.com.cy  Fact Sheet for Healthcare Providers: https://pope.com/  This test is not yet approved or  cleared by the [place] FDA and has been authorized for detection and/or diagnosis of SARS-CoV-2 by FDA under an Emergency Use Authorization (EUA).  This EUA will remain in effect (meaning this test can be used) for the duration of the COVID-19 declaration under Section 564(b)(1) of the Act, 21 U.S.C. section 360bbb-3(b)(1), unless the authorization is terminated or revoked sooner.  Performed at [facility], [address], [place], [place] [address]      Time coordinating discharge: Over 30  minutes  SIGNED:   Tresa Moore, MD  [facility] 03/31/2020, 2:23 PM Pager   If 7PM-7AM, please contact night-coverage

## 2020-03-31 NOTE — Discharge Instructions (Signed)
Cellulitis, Adult  Cellulitis is a skin infection. The infected area is often warm, red, swollen, and sore. It occurs most often in the arms and lower legs. It is very important to get treated for this condition. What are the causes? This condition is caused by bacteria. The bacteria enter through a break in the skin, such as a cut, burn, insect bite, open sore, or crack. What increases the risk? This condition is more likely to occur in people who:  Have a weak body defense system (immune system).  Have open cuts, burns, bites, or scrapes on the skin.  Are older than 34 years of age.  Have a blood sugar problem (diabetes).  Have a long-lasting (chronic) liver disease (cirrhosis) or kidney disease.  Are very overweight (obese).  Have a skin problem, such as: ? Itchy rash (eczema). ? Slow movement of blood in the veins (venous stasis). ? Fluid buildup below the skin (edema).  Have been treated with high-energy rays (radiation).  Use IV drugs. What are the signs or symptoms? Symptoms of this condition include:  Skin that is: ? Red. ? Streaking. ? Spotting. ? Swollen. ? Sore or painful when you touch it. ? Warm.  A fever.  Chills.  Blisters. How is this diagnosed? This condition is diagnosed based on:  Medical history.  Physical exam.  Blood tests.  Imaging tests. How is this treated? Treatment for this condition may include:  Medicines to treat infections or allergies.  Home care, such as: ? Rest. ? Placing cold or warm cloths (compresses) on the skin.  Hospital care, if the condition is very bad. Follow these instructions at home: Medicines  Take over-the-counter and prescription medicines only as told by your doctor.  If you were prescribed an antibiotic medicine, take it as told by your doctor. Do not stop taking it even if you start to feel better. General instructions   Drink enough fluid to keep your pee (urine) pale yellow.  Do not touch  or rub the infected area.  Raise (elevate) the infected area above the level of your heart while you are sitting or lying down.  Place cold or warm cloths on the area as told by your doctor.  Keep all follow-up visits as told by your doctor. This is important. Contact a doctor if:  You have a fever.  You do not start to get better after 1-2 days of treatment.  Your bone or joint under the infected area starts to hurt after the skin has healed.  Your infection comes back. This can happen in the same area or another area.  You have a swollen bump in the area.  You have new symptoms.  You feel ill and have muscle aches and pains. Get help right away if:  Your symptoms get worse.  You feel very sleepy.  You throw up (vomit) or have watery poop (diarrhea) for a long time.  You see red streaks coming from the area.  Your red area gets larger.  Your red area turns dark in color. These symptoms may represent a serious problem that is an emergency. Do not wait to see if the symptoms will go away. Get medical help right away. Call your local emergency services (911 in the [place] Do not drive yourself to the hospital. Summary  Cellulitis is a skin infection. The area is often warm, red, swollen, and sore.  This condition is treated with medicines, rest, and cold and warm cloths.  Take all medicines only   as told by your doctor.  Tell your doctor if symptoms do not start to get better after 1-2 days of treatment. This information is not intended to replace advice given to you by your health care provider. Make sure you discuss any questions you have with your health care provider. Document Revised: 01/04/2018 Document Reviewed: 01/04/2018 Elsevier Patient Education  2020 Elsevier Inc. Clindamycin capsules What is this medicine? CLINDAMYCIN (KLIN da MYE sin) is a lincosamide antibiotic. It is used to treat certain kinds of bacterial infections. It will not work for colds, flu, or  other viral infections. This medicine may be used for other purposes; ask your health care provider or pharmacist if you have questions. COMMON BRAND NAME(S): Cleocin What should I tell my health care provider before I take this medicine? They need to know if you have any of these conditions:  kidney disease  liver disease  stomach problems like colitis  an unusual or allergic reaction to clindamycin, lincomycin, or other medicines, foods, dyes like tartrazine or preservatives  pregnant or trying to get pregnant  breast-feeding How should I use this medicine? Take this medicine by mouth with a full glass of water. Follow the directions on the prescription label. You can take this medicine with food or on an empty stomach. If the medicine upsets your stomach, take it with food. Take your medicine at regular intervals. Do not take your medicine more often than directed. Take all of your medicine as directed even if you think your are better. Do not skip doses or stop your medicine early. Talk to your pediatrician regarding the use of this medicine in children. Special care may be needed. Overdosage: If you think you have taken too much of this medicine contact a poison control center or emergency room at once. NOTE: This medicine is only for you. Do not share this medicine with others. What if I miss a dose? If you miss a dose, take it as soon as you can. If it is almost time for your next dose, take only that dose. Do not take double or extra doses. What may interact with this medicine?  birth control pills  erythromycin  medicines that relax muscles for surgery  rifampin This list may not describe all possible interactions. Give your health care provider a list of all the medicines, herbs, non-prescription drugs, or dietary supplements you use. Also tell them if you smoke, drink alcohol, or use illegal drugs. Some items may interact with your medicine. What should I watch for while  using this medicine? Tell your doctor or health care provider if your symptoms do not start to get better or if they get worse. This medicine may cause serious skin reactions. They can happen weeks to months after starting the medicine. Contact your health care provider right away if you notice fevers or flu-like symptoms with a rash. The rash may be red or purple and then turn into blisters or peeling of the skin. Or, you might notice a red rash with swelling of the face, lips or lymph nodes in your neck or under your arms. Do not treat diarrhea with over the counter products. Contact your doctor if you have diarrhea that lasts more than 2 days or if it is severe and watery. What side effects may I notice from receiving this medicine? Side effects that you should report to your doctor or health care professional as soon as possible:  allergic reactions like skin rash, itching or hives, swelling of  the face, lips, or tongue  dark urine  pain on swallowing  rash, fever, and swollen lymph nodes  redness, blistering, peeling or loosening of the skin, including inside the mouth  unusual bleeding or bruising  unusually weak or tired  yellowing of eyes or skin Side effects that usually do not require medical attention (report to your doctor or health care professional if they continue or are bothersome):  diarrhea  itching in the rectal or genital area  joint pain  nausea, vomiting  stomach pain This list may not describe all possible side effects. Call your doctor for medical advice about side effects. You may report side effects to FDA at 1-800-FDA-1088. Where should I keep my medicine? Keep out of the reach of children. Store at room temperature between 20 and 25 degrees C (68 and 77 degrees F). Throw away any unused medicine after the expiration date. NOTE: This sheet is a summary. It may not cover all possible information. If you have questions about this medicine, talk to your  doctor, pharmacist, or health care provider.  2020 Elsevier/Gold Standard (2018-11-15 12:02:12)

## 2020-03-31 NOTE — Progress Notes (Signed)
PHARMACY -  BRIEF ANTIBIOTIC NOTE   Pharmacy has received consult(s) for Vancomycin from an ED provider.  The patient's profile has been reviewed for ht/wt/allergies/indication/available labs.    One time order(s) placed for Vancomycin 2 gm IV X 1   Further antibiotics/pharmacy consults should be ordered by admitting physician if indicated.                       Thank you, Angelly Spearing D 03/31/2020  12:37 AM

## 2020-03-31 NOTE — ED Provider Notes (Signed)
[facility] Emergency Department Provider Note  ____________________________________________   First MD Initiated Contact with Patient 03/31/20 0004     (approximate)  I have reviewed the triage vital signs and the nursing notes.   HISTORY  Chief Complaint Cellulitis    HPI Kevin Curry is a 34 y.o. male with medical history as listed below who presents for evaluation of worsening pain, swelling, and redness in his right arm.  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  He said that he puts a lot of pressure on his elbows and sometimes has "little sores" and he can usually pick at them and it would go away.  However a few days ago he started having some pain and swelling in his right elbow, picked at the lesion, and since that time it has been intermittently draining fluid and the swelling and redness and pain of extended up towards his shoulder and down all the way to his hand.  He came to the emergency department about 24 hours ago for evaluation but left without being seen due to the wait.  He worked a full day doing Equities tradersome construction and by the end of the day his arm is severely painful, throbbing, swollen, and difficult to fully flex and extend his wrist or his elbow due to the pain.  It is red and warm.  He feels like he may have a fever.  Nothing in particular makes his symptoms better and moving and using the arm makes it worse.  He is up-to-date on his tetanus vaccination.  He has not been vaccinated for COVID-19.        Past Medical History:  Diagnosis Date  . Bipolar 1 disorder [facility](HCC)     Patient Active Problem List   Diagnosis Date Noted  . Cellulitis of right arm 03/31/2020    History reviewed. No pertinent surgical history.  Prior to Admission medications   Medication Sig Start Date End Date Taking? Authorizing Provider  ibuprofen (ADVIL,MOTRIN) 800 MG tablet Take 1 tablet (800 mg total) by mouth every 8 (eight) hours as  needed. Patient not taking: Reported on 04/16/2017 01/26/15   Beers, Charmayne Sheerharles M, PA-C  naproxen (NAPROSYN) 500 MG tablet Take 1 tablet (500 mg total) by mouth 2 (two) times daily with a meal. Patient not taking: Reported on 04/16/2017 04/10/16   Jene EveryKinner, Robert, MD  oxyCODONE-acetaminophen (ROXICET) 5-325 MG per tablet Take 1-2 tablets by mouth every 4 (four) hours as needed for severe pain. Patient not taking: Reported on 04/16/2017 01/26/15   Evangeline DakinBeers, Charles M, PA-C    Allergies Codeine  Family History  Problem Relation Age of Onset  . Heart disease Father     Social History Social History   Tobacco Use  . Smoking status: Current Every Day Smoker    Packs/day: 0.50    Years: 5.00    Pack years: 2.50    Types: Cigarettes  . Smokeless tobacco: Never Used  Substance Use Topics  . Alcohol use: Yes    Comment: occasionaly  . Drug use: Yes    Types: Marijuana, Cocaine    Review of Systems Constitutional: Subjective fever Eyes: No visual changes. ENT: No sore throat. Cardiovascular: Denies chest pain. Respiratory: Denies shortness of breath. Gastrointestinal: No abdominal pain.  No nausea, no vomiting.  No diarrhea.  No constipation. Genitourinary: Negative for dysuria. Musculoskeletal: Pain and swelling in the right arm starting with a wound on his right elbow Integumentary: Redness and swelling  in the right arm. Neurological: Negative for headaches, focal weakness or numbness.   ____________________________________________   PHYSICAL EXAM:  VITAL SIGNS: ED Triage Vitals  Enc Vitals Group     BP 03/30/20 2323 (!) 157/82     Pulse Rate 03/30/20 2323 98     Resp 03/30/20 2323 20     Temp 03/30/20 2323 100 F (37.8 C)     Temp Source 03/30/20 2323 Oral     SpO2 03/30/20 2323 99 %     Weight 03/30/20 2255 95.3 kg (210 lb)     Height 03/30/20 2255 1.829 m (6')     Head Circumference --      Peak Flow --      Pain Score 03/30/20 2255 10     Pain Loc --      Pain Edu?  --      Excl. in GC? --     Constitutional: Alert and oriented.  Appears uncomfortable. Eyes: Conjunctivae are normal.  Head: Atraumatic. Nose: No congestion/rhinnorhea. Mouth/Throat: Patient is wearing a mask. Neck: No stridor.  No meningeal signs.   Cardiovascular: Mild tachycardia with a heart rate around 100, regular rhythm. Good peripheral circulation. Grossly normal heart sounds. Respiratory: Normal respiratory effort.  No retractions. Gastrointestinal: Soft and nontender. No distention.  Musculoskeletal: Edema throughout the right upper extremity.  Compartments are tense but not hard and without evidence of compartment syndrome.  Limited range of motion of wrist and elbow.  See skin exam for additional details. Neurologic:  Normal speech and language. No gross focal neurologic deficits are appreciated.  Skin:  Skin is warm, dry and intact except for a small wound to the right elbow, no surrounding fluctuance, no active drainage at this time but it was draining previously.  There is erythema throughout the right arm and streaking cellulitis extending up nearly to the axilla and down all the way through the hand.  Consistent with cellulitis.  Pain/tenderness not out of proportion with exam, however. Psychiatric: Mood and affect are normal. Speech and behavior are normal.  ____________________________________________   LABS (all labs ordered are listed, but only abnormal results are displayed)  Labs Reviewed  CBC WITH DIFFERENTIAL/PLATELET - Abnormal; Notable for the following components:      Result Value   WBC 18.2 (*)    RBC 4.05 (*)    Hemoglobin 12.2 (*)    HCT 35.1 (*)    Neutro Abs 14.9 (*)    Monocytes Absolute 1.6 (*)    Abs Immature Granulocytes 0.09 (*)    All other components within normal limits  COMPREHENSIVE METABOLIC PANEL - Abnormal; Notable for the following components:   Glucose, Bld 111 (*)    All other components within normal limits  CULTURE, BLOOD  (ROUTINE X 2)  CULTURE, BLOOD (ROUTINE X 2)  SARS CORONAVIRUS 2 BY RT PCR (HOSPITAL ORDER, PERFORMED IN [facility])  LACTIC ACID, PLASMA  PROCALCITONIN  HIV ANTIBODY (ROUTINE TESTING W REFLEX)   ____________________________________________  EKG  No indication for emergent EKG ____________________________________________  RADIOLOGY Marylou Mccoy, personally viewed and evaluated these images (plain radiographs) as part of my medical decision making, as well as reviewing the written report by the radiologist.  ED MD interpretation: Extensive soft tissue swelling, no evidence of osteomyelitis, no subcutaneous gas.  Official radiology report(s): DG Elbow Complete Right  Result Date: 03/30/2020 CLINICAL DATA:  Draining ulcer to right elbow with swelling EXAM: RIGHT ELBOW - COMPLETE 3+ VIEW COMPARISON:  None.  FINDINGS: No fracture or malalignment. No periostitis or bone destruction. No definitive elbow effusion. Extensive soft tissue swelling. Suspected small ulcer along the dorsal elbow overlying the olecranon. No soft tissue emphysema. IMPRESSION: 1. No acute osseous abnormality. 2. Extensive soft tissue swelling with suspected ulcer along the dorsal elbow overlying the olecranon. No radiographic evidence for osteomyelitis. Electronically Signed   By: Jasmine Pang M.D.   On: 03/30/2020 23:16    ____________________________________________   PROCEDURES   Procedure(s) performed (including Critical Care):  .Critical Care Performed by: Loleta Rose, MD Authorized by: Loleta Rose, MD   Critical care provider statement:    Critical care time (minutes):  30   Critical care time was exclusive of:  Separately billable procedures and treating other patients   Critical care was necessary to treat or prevent imminent or life-threatening deterioration of the following conditions:  Sepsis   Critical care was time spent personally by me on the following activities:   Development of treatment plan with patient or surrogate, discussions with consultants, evaluation of patient's response to treatment, examination of patient, obtaining history from patient or surrogate, ordering and performing treatments and interventions, ordering and review of laboratory studies, ordering and review of radiographic studies, pulse oximetry, re-evaluation of patient's condition and review of old charts     ____________________________________________   INITIAL IMPRESSION / MDM / ASSESSMENT AND PLAN / ED COURSE  As part of my medical decision making, I reviewed the following data within the electronic MEDICAL RECORD NUMBER Nursing notes reviewed and incorporated, Labs reviewed , Old chart reviewed, Discussed with admitting physician (Dr. Para March), Notes from prior ED visits and [place] Controlled Substance Database   Differential diagnosis includes, but is not limited to, sepsis, cellulitis, necrotizing fasciitis, foreign body, osteomyelitis, bursitis.  The patient may have some mild degree of bursitis given his work, but currently he is suffering from a relatively rapidly swelling cellulitis that exists extending both distally and proximally from his elbow wound.  I see no drainable fluid collection at this time.  X-rays were personally reviewed by me and show no sign of subcutaneous gas or osteomyelitis and based on the physical exam and imaging I do not believe this is necrotizing fasciitis.  His leukocytosis is greater than 18 and his heart rate is around 100 and he meets criteria for sepsis.  I am treating empirically with ceftriaxone 2 g IV, vancomycin 1 g IV, and pharmacy consult for the vancomycin dosing.  I have ordered 3 L of lactated Ringer's which is 30 mL/kg.  Lactic acid is within normal limits, however.  Procalcitonin and blood cultures are pending.  I will consult the hospitalist for admission.  The patient agrees to the plan to stay.  He is up-to-date on Tdap.       Clinical  Course as of Mar 31 141  Tue Mar 31, 2020  0056 Discussed case by phone with Dr. Para March with the hospitalist service.  She will admit.   [CF]    Clinical Course User Index [CF] Loleta Rose, MD     ____________________________________________  FINAL CLINICAL IMPRESSION(S) / ED DIAGNOSES  Final diagnoses:  Swelling  Sepsis, due to unspecified organism, unspecified whether acute organ dysfunction present (HCC)  Right arm cellulitis     MEDICATIONS GIVEN DURING THIS VISIT:  Medications  lactated ringers bolus 1,000 mL (has no administration in time range)    And  lactated ringers bolus 1,000 mL (has no administration in time range)    And  lactated ringers bolus 1,000 mL (1,000 mLs Intravenous New Bag/Given 03/31/20 0132)  vancomycin (VANCOREADY) IVPB 2000 mg/400 mL (2,000 mg Intravenous New Bag/Given 03/31/20 0130)  cefTRIAXone (ROCEPHIN) 1 g in sodium chloride 0.9 % 100 mL IVPB (has no administration in time range)  enoxaparin (LOVENOX) injection 40 mg (has no administration in time range)  0.9 %  sodium chloride infusion (has no administration in time range)  acetaminophen (TYLENOL) tablet 650 mg (has no administration in time range)    Or  acetaminophen (TYLENOL) suppository 650 mg (has no administration in time range)  ibuprofen (ADVIL) tablet 600 mg (has no administration in time range)  vancomycin (VANCOCIN) IVPB 1000 mg/200 mL premix (has no administration in time range)  cefTRIAXone (ROCEPHIN) 2 g in sodium chloride 0.9 % 100 mL IVPB (0 g Intravenous Stopped 03/31/20 0128)  morphine 4 MG/ML injection 4 mg (4 mg Intravenous Given 03/31/20 0040)  ondansetron (ZOFRAN) injection 4 mg (4 mg Intravenous Given 03/31/20 0040)  dexamethasone (DECADRON) injection 10 mg (10 mg Intravenous Given 03/31/20 0102)     ED Discharge Orders    None      *Please note:  Kevin Curry was evaluated in Emergency Department on 03/31/2020 for the symptoms described in the history of  present illness. He was evaluated in the context of the global COVID-19 pandemic, which necessitated consideration that the patient might be at risk for infection with the SARS-CoV-2 virus that causes COVID-19. Institutional protocols and algorithms that pertain to the evaluation of patients at risk for COVID-19 are in a state of rapid change based on information released by regulatory bodies including the CDC and federal and state organizations. These policies and algorithms were followed during the patient's care in the ED.  Some ED evaluations and interventions may be delayed as a result of limited staffing during and after the pandemic.*  Note:  This document was prepared using Dragon voice recognition software and may include unintentional dictation errors.   Loleta Rose, MD 03/31/20 [phone]

## 2020-03-31 NOTE — Progress Notes (Signed)
CODE SEPSIS - PHARMACY COMMUNICATION  **Broad Spectrum Antibiotics should be administered within 1 hour of Sepsis diagnosis**  Time Code Sepsis Called/Page Received:  8/3 @ 0027  Antibiotics Ordered: Vanc , Ceftriaxone   Time of 1st antibiotic administration: 8/3 @ 0044   Additional action taken by pharmacy:   If necessary, Name of Provider/Nurse Contacted:     Stavros Cail D ,PharmD Clinical Pharmacist  03/31/2020  1:32 AM

## 2020-03-31 NOTE — Progress Notes (Signed)
Brief hospitalist update note.  This is a nonbillable note.  Please see scanned H&P for full billable details.  34 year old male presented with right upper extremity and elbow cellulitis.  Started on broad-spectrum antibiotics and pain control.  Improving over interval. Imaging did not reveal any evidence of osteomyelitis  Plan: Continue broad-spectrum antibiotics, vancomycin and Rocephin Follow cultures, can likely de-escalate antibiotics within 24 hours Pain control as needed, Toradol 15 mg IV every 6 hours scheduled for now Serial exams If clinical concern arises for deep-seated infection recommend MRI right upper extremity  Patient also has history of substance abuse.  Unclear whether this has anything to do with his current presentation.  Urinalysis and urine drug screen ordered.  Lolita Patella MD

## 2020-03-31 NOTE — H&P (Signed)
History and Physical    Kevin Curry SHF:[id number] DOB: 31-Oct-1985 DOA: 03/30/2020  PCP: Patient, No Pcp Per   Patient coming from: Home  I have personally briefly reviewed patient's old medical records in [facility]  Chief Complaint: Swelling right arm  HPI: Kevin Curry is a 34 y.o. male with no significant past medical history who presents to the ER with pain and swelling of the right forearm that has gradually worsened over the past several days.  Patient has a wound on the left elbow for the past few days which he states he has been picking at and he noticed pain swelling and redness that started extending downwards towards the hand and now upwards on the inner upper arm .  He denies fever or chills.  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  White cell count 18,000 with lactic acid 1.1 and otherwise unremarkable.  X-ray of the right extremity showed no acute osseous abnormality but showed extensive soft tissue swelling with suspected ulcer along the dorsal elbow overlying the olecranon no radiographic evidence of osteomyelitis.  Patient started on Rocephin and vancomycin, IV hydration.  He was given a dose of IV Decadron to help with extensive swelling.  Hospitalist consulted for admission.  Review of Systems: As per HPI otherwise all other systems on review of systems negative.    Past Medical History:  Diagnosis Date  . Bipolar 1 disorder (HCC)     History reviewed. No pertinent surgical history.   reports that he has been smoking cigarettes. He has a 2.50 pack-year smoking history. He has never used smokeless tobacco. He reports current alcohol use. He reports current drug use. Drugs: Marijuana and Cocaine.  Allergies  Allergen Reactions  . Codeine Swelling    Family History  Problem Relation Age of Onset    . Heart disease Father       Prior to Admission medications   Medication Sig Start Date End Date Taking? Authorizing Provider  ibuprofen (ADVIL,MOTRIN) 800 MG tablet Take 1 tablet (800 mg total) by mouth every 8 (eight) hours as needed. Patient not taking: Reported on 04/16/2017 01/26/15   Beers, Charmayne Sheer, PA-C  naproxen (NAPROSYN) 500 MG tablet Take 1 tablet (500 mg total) by mouth 2 (two) times daily with a meal. Patient not taking: Reported on 04/16/2017 04/10/16   Jene Every, MD  oxyCODONE-acetaminophen (ROXICET) 5-325 MG per tablet Take 1-2 tablets by mouth every 4 (four) hours as needed for severe pain. Patient not taking: Reported on 04/16/2017 01/26/15   Evangeline Dakin, PA-C    Physical Exam: Vitals:   03/30/20 2255 03/30/20 2323 03/31/20 0104  BP:  (!) 157/82 138/80  Pulse:  98 87  Resp:  20 16  Temp:  100 F (37.8 C)   TempSrc:  Oral   SpO2:  99% 99%  Weight: 95.3 kg    Height: 6' (1.829 m)       Vitals:   03/30/20 2255 03/30/20 2323 03/31/20 0104  BP:  (!) 157/82 138/80  Pulse:  98 87  Resp:  20 16  Temp:  100 F (37.8 C)   TempSrc:  Oral   SpO2:  99% 99%  Weight: 95.3 kg    Height: 6' (1.829 m)        Constitutional: Alert and oriented x  3 . Not in any apparent distress HEENT:      Head: Normocephalic and atraumatic.         Eyes: PERLA, EOMI, Conjunctivae are normal. Sclera is non-icteric.       Mouth/Throat: Mucous membranes are moist.       Neck: Supple with no signs of meningismus. Cardiovascular: Regular rate and rhythm. No murmurs, gallops, or rubs. 2+ symmetrical distal pulses are present . No JVD. No LE edema Respiratory: Respiratory effort normal .Lungs sounds clear bilaterally. No wheezes, crackles, or rhonchi.  Gastrointestinal: Soft, non tender, and non distended with positive bowel sounds. No rebound or guarding. Genitourinary: No CVA tenderness. Musculoskeletal:  1 cm ulcer right elbow with surrounding redness with swelling and  redness extending to the dorsum of right hand and medial aspect right upper extremity Neurologic: Normal speech and language. Face is symmetric. Moving all extremities. No gross focal neurologic deficits . Skin: Skin is warm, dry.  Also right elbow, see description under musculoskeletal Psychiatric: Mood and affect are normal Speech and behavior are normal   Labs on Admission: I have personally reviewed following labs and imaging studies  CBC: Recent Labs  Lab 03/30/20 2253  WBC 18.2*  NEUTROABS 14.9*  HGB 12.2*  HCT 35.1*  MCV 86.7  PLT 275   Basic Metabolic Panel: Recent Labs  Lab 03/30/20 2253  NA 137  K 3.5  CL 103  CO2 24  GLUCOSE 111*  BUN 11  CREATININE 0.83  CALCIUM 9.0   GFR: Estimated Creatinine Clearance: 150.2 mL/min (by C-G formula based on SCr of 0.83 mg/dL). Liver Function Tests: Recent Labs  Lab 03/30/20 2253  AST 17  ALT 12  ALKPHOS 59  BILITOT 0.8  PROT 7.4  ALBUMIN 4.1   No results for input(s): LIPASE, AMYLASE in the last 168 hours. No results for input(s): AMMONIA in the last 168 hours. Coagulation Profile: No results for input(s): INR, PROTIME in the last 168 hours. Cardiac Enzymes: No results for input(s): CKTOTAL, CKMB, CKMBINDEX, TROPONINI in the last 168 hours. BNP (last 3 results) No results for input(s): PROBNP in the last 8760 hours. HbA1C: No results for input(s): HGBA1C in the last 72 hours. CBG: No results for input(s): GLUCAP in the last 168 hours. Lipid Profile: No results for input(s): CHOL, HDL, LDLCALC, TRIG, CHOLHDL, LDLDIRECT in the last 72 hours. Thyroid Function Tests: No results for input(s): TSH, T4TOTAL, FREET4, T3FREE, THYROIDAB in the last 72 hours. Anemia Panel: No results for input(s): VITAMINB12, FOLATE, FERRITIN, TIBC, IRON, RETICCTPCT in the last 72 hours. Urine analysis:    Component Value Date/Time   COLORURINE Yellow 09/15/2013 0127   APPEARANCEUR Clear 09/15/2013 0127   LABSPEC 1.020 09/15/2013  0127   PHURINE 6.0 09/15/2013 0127   GLUCOSEU Negative 09/15/2013 0127   HGBUR Negative 09/15/2013 0127   BILIRUBINUR Negative 09/15/2013 0127   KETONESUR Negative 09/15/2013 0127   PROTEINUR Negative 09/15/2013 0127   NITRITE Negative 09/15/2013 0127   LEUKOCYTESUR Negative 09/15/2013 0127    Radiological Exams on Admission: DG Elbow Complete Right  Result Date: 03/30/2020 CLINICAL DATA:  Draining ulcer to right elbow with swelling EXAM: RIGHT ELBOW - COMPLETE 3+ VIEW COMPARISON:  None. FINDINGS: No fracture or malalignment. No periostitis or bone destruction. No definitive elbow effusion. Extensive soft tissue swelling. Suspected small ulcer along the dorsal elbow overlying the olecranon. No soft tissue emphysema. IMPRESSION: 1. No acute osseous abnormality. 2. Extensive soft tissue swelling with suspected ulcer along the dorsal elbow overlying  the olecranon. No radiographic evidence for osteomyelitis. Electronically Signed   By: Jasmine Pang M.D.   On: 03/30/2020 23:16    EKG: Not done in the ER  Assessment/Plan 34 year old male with no significant past medical history presenting with cellulitis of the right arm related to infected wound right elbow   Cellulitis of right arm Infected ulcer left elbow -Continue Rocephin and vancomycin started in the emergency room -Continue IV hydration -Patient up-to-date with tetanus shot -Transition to oral antibiotics once improving    DVT prophylaxis: Lovenox  Code Status: full code  Family Communication:  none  Disposition Plan: Back to previous home environment Consults called: none  Status:At the time of admission, it appears that the appropriate admission status for this patient is INPATIENT. This is judged to be reasonable and necessary in order to provide the required intensity of service to ensure the patient's safety given the presenting symptoms, physical exam findings, and initial radiographic and laboratory data in the context of  their  Comorbid conditions.   Patient requires inpatient status due to high intensity of service, high risk for further deterioration and high frequency of surveillance required.   I certify that at the point of admission it is my clinical judgment that the patient will require inpatient hospital care spanning beyond 2 midnights     Andris Baumann MD [facility]     03/31/2020, 1:14 AM

## 2020-03-31 NOTE — Care Management (Signed)
Provided patient with goodrx coupon for oral clinda at discharge.  Patient confirms he will be able to obtain at discharge

## 2020-03-31 NOTE — Progress Notes (Signed)
Pharmacy Antibiotic Note  Kevin Curry is a 34 y.o. male admitted on 03/30/2020 with cellulitis.  Pharmacy has been consulted for Vancomycin dosing.  Plan: Vancomycin 2 gm IV X 1 given on 8/3 @ 0130. Vancomycin 1 gm IV Q8H ordered to start @ 0930.   Height: 6' (182.9 cm) Weight: 95.3 kg (210 lb) IBW/kg (Calculated) : 77.6  Temp (24hrs), Avg:100 F (37.8 C), Min:100 F (37.8 C), Max:100 F (37.8 C)  Recent Labs  Lab 03/30/20 2253  WBC 18.2*  CREATININE 0.83  LATICACIDVEN 1.1    Estimated Creatinine Clearance: 150.2 mL/min (by C-G formula based on SCr of 0.83 mg/dL).    Allergies  Allergen Reactions  . Codeine Swelling    Antimicrobials this admission:   >>    >>   Dose adjustments this admission:   Microbiology results:  BCx:   UCx:    Sputum:    MRSA PCR:   Thank you for allowing pharmacy to be a part of this patient's care.  Kevin Curry 03/31/2020 1:34 AM

## 2020-04-04 LAB — CULTURE, BLOOD (ROUTINE X 2): Culture: NO GROWTH

## 2020-04-05 LAB — CULTURE, BLOOD (ROUTINE X 2): Culture: NO GROWTH

## 2020-05-24 IMAGING — CR DG RIBS W/ CHEST 3+V*L*
3 series · 3 of 3 positions shown · non-contrast
Comparison: Chest x-ray August 04, 2016

CLINICAL DATA: Pain after fall

EXAM:
LEFT RIBS AND CHEST - 3+ VIEW

[chest pa]
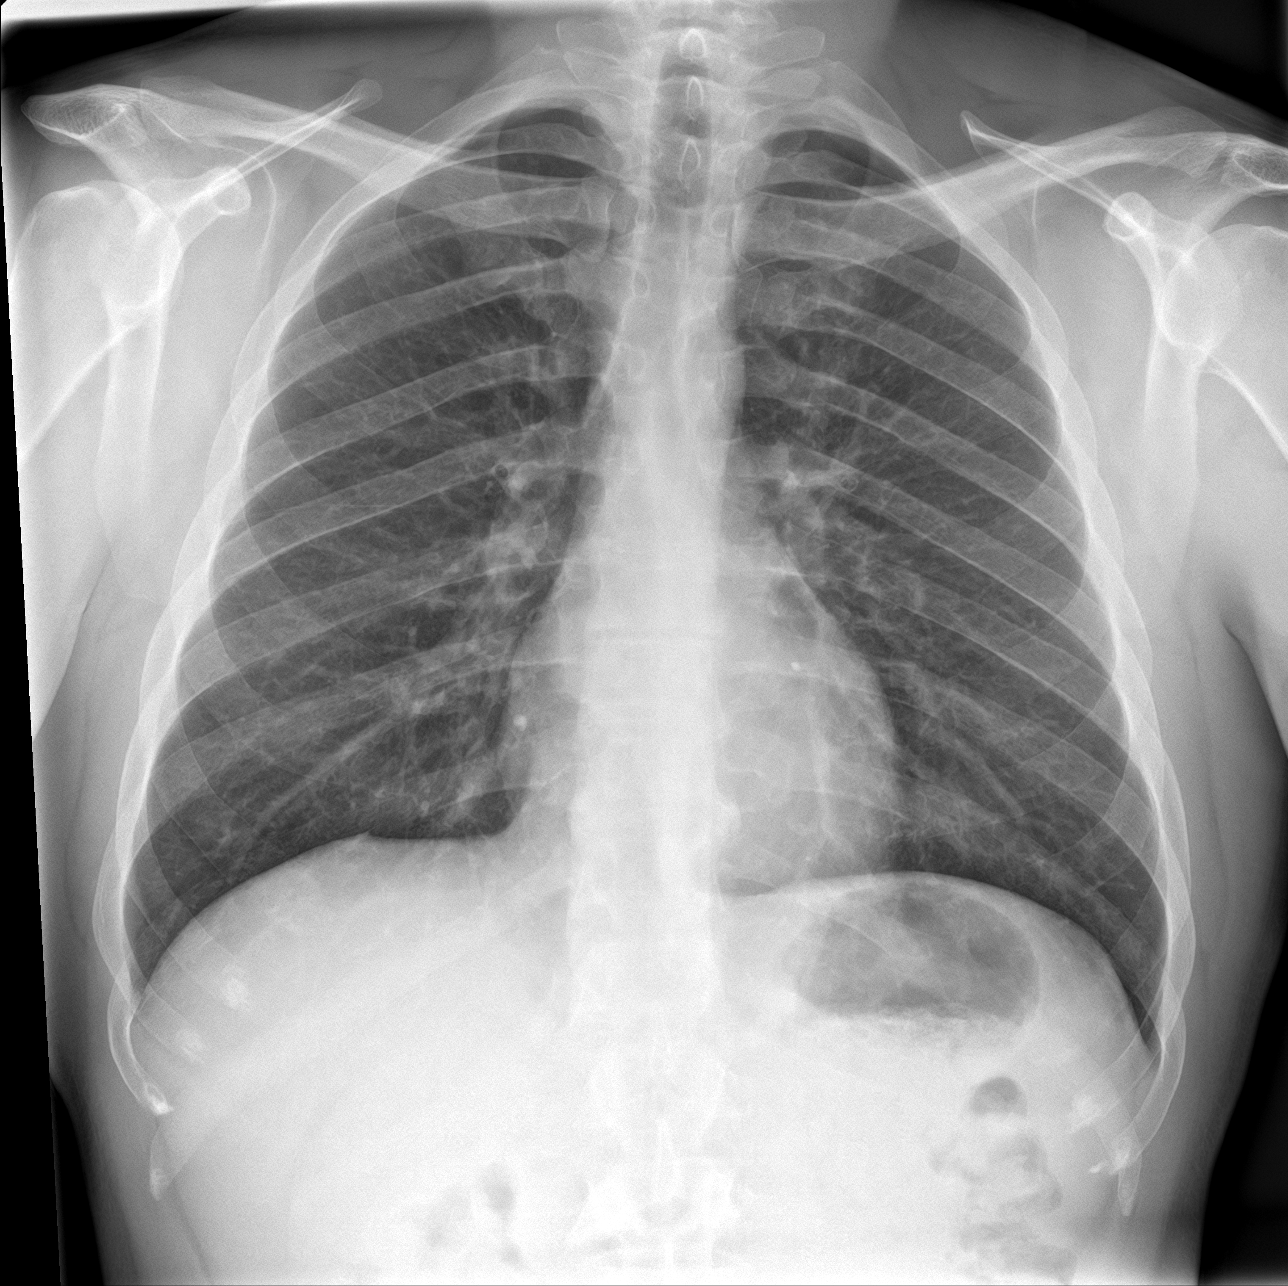

[rib pa]
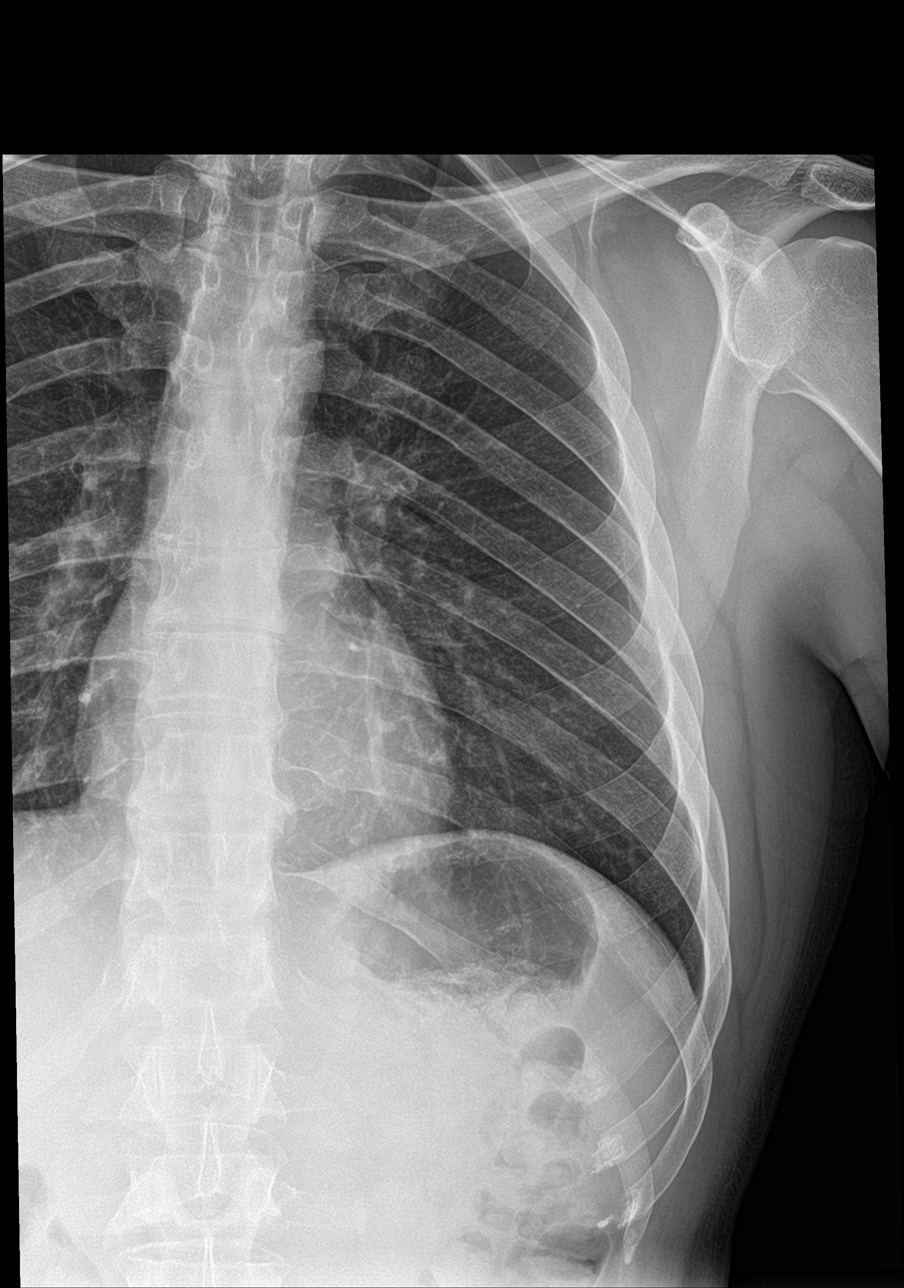

[rib pa obl]
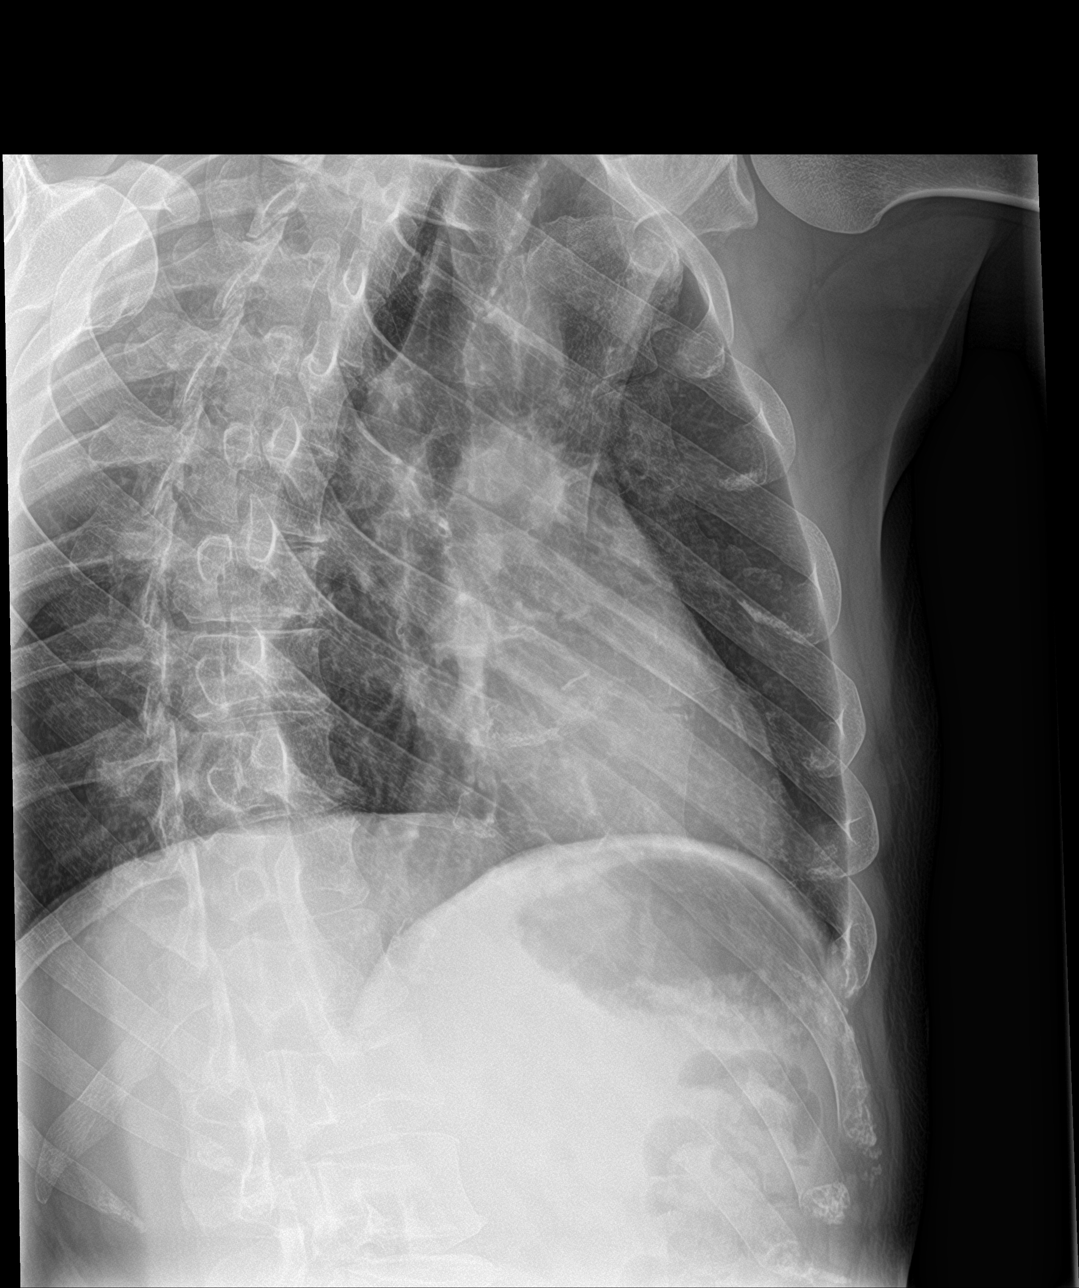

[3 of 3 positions shown; findings below may reference images not displayed]

FINDINGS: No fracture or other bone lesions are seen involving the ribs. There
is no evidence of pneumothorax or pleural effusion. Both lungs are
clear. Heart size and mediastinal contours are within normal limits.
IMPRESSION: Negative.

## 2022-12-05 ENCOUNTER — Emergency Department: Payer: Self-pay

## 2022-12-05 ENCOUNTER — Emergency Department
Admission: EM | Admit: 2022-12-05 | Discharge: 2022-12-05 | Disposition: A | Discharge status: Home / Self Care | Payer: Self-pay | Attending: Emergency Medicine | Admitting: Emergency Medicine

## 2022-12-05 ENCOUNTER — Other Ambulatory Visit: Payer: Self-pay

## 2022-12-05 DIAGNOSIS — R45851 Suicidal ideations: Secondary | ICD-10-CM | POA: Insufficient documentation

## 2022-12-05 DIAGNOSIS — F1414 Cocaine abuse with cocaine-induced mood disorder: Secondary | ICD-10-CM | POA: Insufficient documentation

## 2022-12-05 DIAGNOSIS — M542 Cervicalgia: Secondary | ICD-10-CM | POA: Insufficient documentation

## 2022-12-05 DIAGNOSIS — F1910 Other psychoactive substance abuse, uncomplicated: Secondary | ICD-10-CM | POA: Insufficient documentation

## 2022-12-05 LAB — COMPREHENSIVE METABOLIC PANEL
ALT: 22 U/L (ref 0–44)
AST: 27 U/L (ref 15–41)
Albumin: 4.1 g/dL (ref 3.5–5.0)
Alkaline Phosphatase: 62 U/L (ref 38–126)
Anion gap: 7 (ref 5–15)
BUN: 15 mg/dL (ref 6–20)
CO2: 27 mmol/L (ref 22–32)
Calcium: 9 mg/dL (ref 8.9–10.3)
Chloride: 106 mmol/L (ref 98–111)
Creatinine, Ser: 1.35 mg/dL — ABNORMAL HIGH (ref 0.61–1.24)
GFR, Estimated: >60 mL/min (ref >60)
Glucose, Bld: 145 mg/dL — ABNORMAL HIGH (ref 70–99)
Potassium: 3.7 mmol/L (ref 3.5–5.1)
Sodium: 140 mmol/L (ref 135–145)
Total Bilirubin: 0.6 mg/dL (ref 0.3–1.2)
Total Protein: 6.9 g/dL (ref 6.5–8.1)

## 2022-12-05 LAB — CBC
HCT: 40.9 % (ref 39.0–52.0)
Hemoglobin: 13.5 g/dL (ref 13.0–17.0)
MCH: 29.6 pg (ref 26.0–34.0)
MCHC: 33 g/dL (ref 30.0–36.0)
MCV: 89.7 fL (ref 80.0–100.0)
Platelets: 302 10*3/uL (ref 150–400)
RBC: 4.56 MIL/uL (ref 4.22–5.81)
RDW: 12.6 % (ref 11.5–15.5)
WBC: 9 10*3/uL (ref 4.0–10.5)
nRBC: 0 % (ref 0.0–0.2)

## 2022-12-05 LAB — URINE DRUG SCREEN, QUALITATIVE (ARMC ONLY)
Amphetamines, Ur Screen: NOT DETECTED
Barbiturates, Ur Screen: NOT DETECTED
Benzodiazepine, Ur Scrn: NOT DETECTED
Cannabinoid 50 Ng, Ur ~~LOC~~: POSITIVE — AB
Cocaine Metabolite,Ur ~~LOC~~: POSITIVE — AB
MDMA (Ecstasy)Ur Screen: NOT DETECTED
Methadone Scn, Ur: NOT DETECTED
Opiate, Ur Screen: NOT DETECTED
Phencyclidine (PCP) Ur S: NOT DETECTED
Tricyclic, Ur Screen: NOT DETECTED

## 2022-12-05 LAB — ETHANOL: Alcohol, Ethyl (B): <10 mg/dL (ref <10)

## 2022-12-05 LAB — ACETAMINOPHEN LEVEL: Acetaminophen (Tylenol), Serum: <10 ug/mL — ABNORMAL LOW (ref 10–30)

## 2022-12-05 LAB — SALICYLATE LEVEL: Salicylate Lvl: <7 mg/dL — ABNORMAL LOW (ref 7.0–30.0)

## 2022-12-05 NOTE — ED Triage Notes (Signed)
Pt presents to ER with [employer] with c/o SI.  Pt states he is constantly having hallucinations, and has voices that are telling him to do things.  Pt endorses having [employer], schizophrenia, and MDD.  Officer states that pt admitted to "smoking crack, and swallowed a corner baggy."  Pt endorses pain all over at this time.  Pt is otherwise A&O x4 at this time.

## 2022-12-05 NOTE — ED Provider Notes (Signed)
[facility] Provider Note    None    (approximate)   History   Suicidal   HPI  Kevin Curry is a 37 y.o. male  who comes in with SI.  Patient was getting arrested when he reportedly developed suicidal ideations.  He reports using some crack cocaine prior to coming in.  Ports that he was in a tiny corner of a bag but the bag was not clear shot it was open.  He denies there being any heroin or other drugs and it.  Denies any alcohol use.  He does report hitting his head yesterday on a forklift and having some headaches patient reports having SI.  He reports having hallucination voices telling him to do things.  He reports having history of psychiatric illness but is not currently on any medications.  Patient comes in voluntary but under police custody   Physical Exam   Triage Vital Signs: ED Triage Vitals [12/05/22 1946]  Enc Vitals Group     BP (!) 159/86     Pulse Rate 74     Resp 18     Temp 98.7 F (37.1 C)     Temp Source Oral     SpO2 96 %     Weight      Height      Head Circumference      Peak Flow      Pain Score      Pain Loc      Pain Edu?      Excl. in GC?     Most recent vital signs: Vitals:   12/05/22 1946  BP: (!) 159/86  Pulse: 74  Resp: 18  Temp: 98.7 F (37.1 C)  SpO2: 96%     General: Awake, no distress.  CV:  Good peripheral perfusion.  Resp:  Normal effort.  Abd:  No distention.  Other:  Patient has a knot noted on the back of the head.  He reports some neck pain.  Unclear if this not is new or old.   ED Results / Procedures / Treatments   Labs (all labs ordered are listed, but only abnormal results are displayed) Labs Reviewed  COMPREHENSIVE METABOLIC PANEL - Abnormal; Notable for the following components:      Result Value   Glucose, Bld 145 (*)    Creatinine, Ser 1.35 (*)    All other components within normal limits  ETHANOL  CBC  SALICYLATE LEVEL  ACETAMINOPHEN LEVEL  URINE DRUG SCREEN,  QUALITATIVE ([facility] ONLY)     EKG  My interpretation of EKG: Normal sinus rate of 68 without any ST elevation or T wave inversions, normal intervals   RADIOLOGY I have reviewed the CT had personally and interpreted no ICH  PROCEDURES:  Critical Care performed: No  Procedures   MEDICATIONS ORDERED IN ED: Medications - No data to display   IMPRESSION / MDM / ASSESSMENT AND PLAN / ED COURSE  I reviewed the triage vital signs and the nursing notes.   Patient's presentation is most consistent with acute presentation with potential threat to life or bodily function.   Patient reported some headaches and possibly hit his head yesterday with hematoma potentially on the back of the head.  Will get CT imaging to rule out any endocrine hemorrhage, cervical fracture.  As for swallowing this tiny corner piece of a bag with some crack cocaine in it.  He denies any opioids being at the bag was not  sealed shut.  EKG without any evidence of arrhythmia.  Do not feel he needs any prolonged monitoring he is answering all questions appropriately alert and oriented vitals have been stable here.    CT head and neck normal     No exam findings to suggest medical cause of current presentation. Will order psychiatric screening labs and discuss further w/ psychiatric service.  D/d includes but is not limited to psychiatric disease, behavioral/personality disorder, inadequate socioeconomic support, medical.  Based on HPI, exam, unremarkable labs, no concern for acute medical problem at this time. No rigidity, clonus, hyperthermia, focal neurologic deficit, diaphoresis, tachycardia, meningismus, ataxia, gait abnormality or other finding to suggest this visit represents a non-psychiatric problem. Screening labs reviewed.    Given this, pt medically cleared, to be dispositioned per Psych.  CBC reassuring.  CMP slightly elevated creatinine, ethanol negative. Urine drugCocaine positive, Cannabinoid  positive.  The patient has been placed in psychiatric observation due to the need to provide a safe environment for the patient while obtaining psychiatric consultation and evaluation, as well as ongoing medical and medication management to treat the patient's condition.  The patient has not been placed under full IVC at this time.    The patient is on the cardiac monitor to evaluate for evidence of arrhythmia and/or significant heart rate changes.      FINAL CLINICAL IMPRESSION(S) / ED DIAGNOSES   Final diagnoses:  Substance abuse     Rx / DC Orders   ED Discharge Orders     None        Note:  This document was prepared using Dragon voice recognition software and may include unintentional dictation errors.   Concha Se, MD 12/05/22 2221

## 2022-12-05 NOTE — ED Provider Notes (Signed)
Patient in police custody received in signout from Dr. Fuller Plan pending psychiatric evaluation.  Evaluated by psychiatric nurse practitioner who cleared the patient for outpatient management.  We will discharge per original plan of care to police custody.   Delton Prairie, MD 12/05/22 [phone]

## 2022-12-05 NOTE — ED Notes (Signed)
Pt to triage 3 in forensic restraints.  Pt cooperative and pleasant, and restraints removed.  Pt in tears, stating "Im going to take my life as soon as I get out of here".  Blood drawn and pt dressed out in blue paper scrubs with myself and attending officer in room.  Pts belongings, which include tshirt and black shirt that was ripped and tattered because he had it tied to his pants to keep them up, jeans, brown boots, leggings and a bra, bagged, labelled and placed at nurses station.  Awaiting triage.

## 2022-12-05 NOTE — ED Notes (Signed)
Pt provided with sandwich tray and sprite.

## 2022-12-06 DIAGNOSIS — F1414 Cocaine abuse with cocaine-induced mood disorder: Secondary | ICD-10-CM | POA: Insufficient documentation

## 2022-12-06 NOTE — Consult Note (Incomplete)
[facility] Face-to-Face Psychiatry Consult   Reason for Consult:Suicidal   Referring Physician: Dr. Fuller Plan Patient Identification: Kevin Curry Curry MRN:  [id number] Principal Diagnosis: <principal problem not specified> Diagnosis:  Active Problems:   Cocaine abuse with cocaine-induced mood disorder   Total Time spent with patient: 1 hour  Subjective: "I swallowed a bag of crack cocaine."   Kevin Curry is a 37 y.o. male patient presented to [facility] ED****. The patient was seen face-to-face by this provider; chart reviewed and consulted with Dr. ED providers and Dr. Jenel Lucks who ever doc on call on 04/00/2024 due to the care of the patient. This provider discussed with the EDP that the patient meets/do not meet the criteria for admission to the psychiatric inpatient unit.  On evaluation the patient reports....  During his/her evaluation, @NAME  @ is alert and oriented x4, calm, cooperative, and mood-congruent with affect.  The patient does not appear to be responding to internal or external stimuli. Neither is the patient presenting with any delusional thinking. The patient denies auditory or visual hallucinations. The patient denies any suicidal, homicidal, or self-harm ideations. The patient is not presenting with any psychotic or paranoid behaviors. During an encounter with the patient, he/she was able to answer questions appropriately.  HPI: Per Dr. Edilia Bo Curry is a 37 y.o. male  who comes in with SI.  Patient was getting arrested when he reportedly developed suicidal ideations.  He reports using some crack cocaine prior to coming in.  Ports that he was in a tiny corner of a bag but the bag was not clear shot it was open.  He denies there being any heroin or other drugs and it.  Denies any alcohol use.  He does report hitting his head yesterday on a forklift and having some headaches patient reports having SI.  He reports having hallucination voices telling him to do things.  He  reports having history of psychiatric illness but is not currently on any medications.   Patient comes in voluntary but under police custody   Past Psychiatric History:  Bipolar 1 disorder  Risk to Self:   Risk to Others:   Prior Inpatient Therapy:   Prior Outpatient Therapy:    Past Medical History:  Past Medical History:  Diagnosis Date  . Bipolar 1 disorder    History reviewed. No pertinent surgical history. Family History:  Family History  Problem Relation Age of Onset  . Heart disease Father    Family Psychiatric  History: History reviewed. No pertinent family psychiatric history Social History:  Social History   Substance and Sexual Activity  Alcohol Use Yes   Comment: occasionaly     Social History   Substance and Sexual Activity  Drug Use Yes  . Types: Marijuana, Cocaine    Social History   Socioeconomic History  . Marital status: Legally Separated    Spouse name: Not on file  . Number of children: Not on file  . Years of education: Not on file  . Highest education level: Not on file  Occupational History  . Not on file  Tobacco Use  . Smoking status: Every Day    Packs/day: 0.50    Years: 5.00    Additional pack years: 0.00    Total pack years: 2.50    Types: Cigarettes  . Smokeless tobacco: Never  Substance and Sexual Activity  . Alcohol use: Yes    Comment: occasionaly  . Drug use: Yes    Types: Marijuana,  Cocaine  . Sexual activity: Not on file  Other Topics Concern  . Not on file  Social History Narrative  . Not on file   Social Determinants of Health   Financial Resource Strain: Not on file  Food Insecurity: Not on file  Transportation Needs: Not on file  Physical Activity: Not on file  Stress: Not on file  Social Connections: Not on file   Additional Social History:    Allergies:   Allergies  Allergen Reactions  . Codeine Swelling    Labs:  Results for orders placed or performed during the hospital encounter of  12/05/22 (from the past 48 hour(s))  Comprehensive metabolic panel     Status: Abnormal   Collection Time: 12/05/22  7:49 PM  Result Value Ref Range   Sodium 140 135 - 145 mmol/L   Potassium 3.7 3.5 - 5.1 mmol/L   Chloride 106 98 - 111 mmol/L   CO2 27 22 - 32 mmol/L   Glucose, Bld 145 (H) 70 - 99 mg/dL    Comment: Glucose reference range applies only to samples taken after fasting for at least 8 hours.   BUN 15 6 - 20 mg/dL   Creatinine, Ser 1.61 (H) 0.61 - 1.24 mg/dL   Calcium 9.0 8.9 - 09.6 mg/dL   Total Protein 6.9 6.5 - 8.1 g/dL   Albumin 4.1 3.5 - 5.0 g/dL   AST 27 15 - 41 U/L   ALT 22 0 - 44 U/L   Alkaline Phosphatase 62 38 - 126 U/L   Total Bilirubin 0.6 0.3 - 1.2 mg/dL   GFR, Estimated >04 >54 mL/min    Comment: (NOTE) Calculated using the CKD-EPI Creatinine Equation (2021)    Anion gap 7 5 - 15    Comment: Performed at [facility], [address] [place], [place] [address]  Ethanol     Status: None   Collection Time: 12/05/22  7:49 PM  Result Value Ref Range   Alcohol, Ethyl (B) <10 <10 mg/dL    Comment: (NOTE) Lowest detectable limit for serum alcohol is 10 mg/dL.  For medical purposes only. Performed at [facility], [address] [place], [place] [address]   Salicylate level     Status: Abnormal   Collection Time: 12/05/22  7:49 PM  Result Value Ref Range   Salicylate Lvl <7.0 (L) 7.0 - 30.0 mg/dL    Comment: Performed at [facility], [address] [place], [place] [address]  Acetaminophen level     Status: Abnormal   Collection Time: 12/05/22  7:49 PM  Result Value Ref Range   Acetaminophen (Tylenol), Serum <10 (L) 10 - 30 ug/mL    Comment: (NOTE) Therapeutic concentrations vary significantly. A range of 10-30 ug/mL  may be an effective concentration for many patients. However, some  are best treated at concentrations outside of this range. Acetaminophen concentrations >150 ug/mL at 4 hours after ingestion  and >50  ug/mL at 12 hours after ingestion are often associated with  toxic reactions.  Performed at [facility], [address] [place], [place] [address]   cbc     Status: None   Collection Time: 12/05/22  7:49 PM  Result Value Ref Range   WBC 9.0 4.0 - 10.5 K/uL   RBC 4.56 4.22 - 5.81 MIL/uL   Hemoglobin 13.5 13.0 - 17.0 g/dL   HCT 57.8 46.9 - 62.9 %   MCV 89.7 80.0 - 100.0 fL   MCH 29.6 26.0 -  34.0 pg   MCHC 33.0 30.0 - 36.0 g/dL   RDW 55.3 74.8 - 27.0 %   Platelets 302 150 - 400 K/uL   nRBC 0.0 0.0 - 0.2 %    Comment: Performed at [facility], [address] [place], [place] [address]  Urine Drug Screen, Qualitative     Status: Abnormal   Collection Time: 12/05/22  7:49 PM  Result Value Ref Range   Tricyclic, Ur Screen NONE DETECTED NONE DETECTED   Amphetamines, Ur Screen NONE DETECTED NONE DETECTED   MDMA (Ecstasy)Ur Screen NONE DETECTED NONE DETECTED   Cocaine Metabolite,Ur [place] POSITIVE (A) NONE DETECTED   Opiate, Ur Screen NONE DETECTED NONE DETECTED   Phencyclidine (PCP) Ur S NONE DETECTED NONE DETECTED   Cannabinoid 50 Ng, Ur [place] POSITIVE (A) NONE DETECTED   Barbiturates, Ur Screen NONE DETECTED NONE DETECTED   Benzodiazepine, Ur Scrn NONE DETECTED NONE DETECTED   Methadone Scn, Ur NONE DETECTED NONE DETECTED    Comment: (NOTE) Tricyclics + metabolites, urine    Cutoff 1000 ng/mL Amphetamines + metabolites, urine  Cutoff 1000 ng/mL MDMA (Ecstasy), urine              Cutoff 500 ng/mL Cocaine Metabolite, urine          Cutoff 300 ng/mL Opiate + metabolites, urine        Cutoff 300 ng/mL Phencyclidine (PCP), urine         Cutoff 25 ng/mL Cannabinoid, urine                 Cutoff 50 ng/mL Barbiturates + metabolites, urine  Cutoff 200 ng/mL Benzodiazepine, urine              Cutoff 200 ng/mL Methadone, urine                   Cutoff 300 ng/mL  The urine drug screen provides only a preliminary, unconfirmed analytical test result and should not be used  for non-medical purposes. Clinical consideration and professional judgment should be applied to any positive drug screen result due to possible interfering substances. A more specific alternate chemical method must be used in order to obtain a confirmed analytical result. Gas chromatography / mass spectrometry (GC/MS) is the preferred confirm atory method. Performed at [facility], [address] [place], [place] [address]     No current facility-administered medications for this encounter.   Current Outpatient Medications  Medication Sig Dispense Refill  . acetaminophen (TYLENOL) 325 MG tablet Take 2 tablets (650 mg total) by mouth every 6 (six) hours as needed for mild pain (or Fever >/= 101). (Patient not taking: Reported on 12/05/2022)    . ibuprofen (ADVIL,MOTRIN) 800 MG tablet Take 1 tablet (800 mg total) by mouth every 8 (eight) hours as needed. (Patient not taking: Reported on 04/16/2017) 30 tablet 0    Musculoskeletal: Strength & Muscle Tone: within normal limits Gait & Station: normal Patient leans: N/A  Psychiatric Specialty Exam:  Presentation  General Appearance:  Appropriate for Environment; Disheveled  Eye Contact: Fleeting  Speech: Clear and Coherent  Speech Volume: Normal  Handedness: Right   Mood and Affect  Mood: Euthymic  Affect: Congruent   Thought Process  Thought Processes: Coherent  Descriptions of Associations:Intact  Orientation:Full (Time, Place and Person)  Thought Content:Logical  History of Schizophrenia/Schizoaffective disorder:No data recorded Duration of Psychotic Symptoms:No data recorded Hallucinations:Hallucinations: None  Ideas of Reference:None  Suicidal Thoughts:Suicidal Thoughts: Yes, Passive SI Passive Intent and/or  Plan: Without Intent; Without Plan  Homicidal Thoughts:Homicidal Thoughts: No   Sensorium  Memory: Immediate Good; Recent Good; Remote  Good  Judgment: Poor  Insight: Fair   Art therapistxecutive Functions  Concentration: Good  Attention Span: Good  Recall: Good  Fund of Knowledge: Good  Language: Good   Psychomotor Activity  Psychomotor Activity: Psychomotor Activity: Normal   Assets  Assets: Communication Skills; Desire for Improvement; Resilience; Social Support   Sleep  Sleep: Sleep: Good   Physical Exam: Physical Exam Vitals and nursing note reviewed.  Constitutional:      Appearance: Normal appearance. He is normal weight.  HENT:     Head: Normocephalic and atraumatic.     Right Ear: External ear normal.     Left Ear: External ear normal.     Nose: Nose normal.     Mouth/Throat:     Mouth: Mucous membranes are moist.  Cardiovascular:     Rate and Rhythm: Normal rate.     Pulses: Normal pulses.  Pulmonary:     Effort: Pulmonary effort is normal.  Musculoskeletal:        General: Normal range of motion.     Cervical back: Normal range of motion and neck supple.  Neurological:     General: No focal deficit present.     Mental Status: He is alert and oriented to person, place, and time.  Psychiatric:        Attention and Perception: Attention and perception normal.        Mood and Affect: Mood normal. Affect is blunt.        Speech: Speech normal.        Behavior: Behavior normal. Behavior is cooperative.        Thought Content: Thought content includes suicidal ideation.        Cognition and Memory: Cognition and memory normal.        Judgment: Judgment is inappropriate.    Review of Systems  Psychiatric/Behavioral:  Positive for substance abuse.   All other systems reviewed and are negative.  Blood pressure (!) 159/86, pulse 74, temperature 98.7 F (37.1 C), temperature source Oral, resp. rate 18, height 6\' 1"  (1.854 m), weight 99.8 kg, SpO2 96 %. Body mass index is 29.03 kg/m.  Treatment Plan Summary: Plan   -Patient does not meet the criteria for psychiatric inpatient  admission.  Disposition: No evidence of imminent risk to self or others at present.   Patient does not meet criteria for psychiatric inpatient admission. Supportive therapy provided about ongoing stressors.  Gillermo MurdochJacqueline Michio Thier, NP 12/06/2022 12:21 AM

## 2023-02-08 ENCOUNTER — Other Ambulatory Visit: Payer: Self-pay

## 2023-02-08 ENCOUNTER — Encounter: Payer: Self-pay | Admitting: Emergency Medicine

## 2023-02-08 ENCOUNTER — Emergency Department
Admission: EM | Admit: 2023-02-08 | Discharge: 2023-02-08 | Disposition: A | Discharge status: Home / Self Care | Attending: Emergency Medicine | Admitting: Emergency Medicine

## 2023-02-08 DIAGNOSIS — F23 Brief psychotic disorder: Secondary | ICD-10-CM | POA: Insufficient documentation

## 2023-02-08 DIAGNOSIS — T551X2A Toxic effect of detergents, intentional self-harm, initial encounter: Secondary | ICD-10-CM | POA: Diagnosis present

## 2023-02-08 DIAGNOSIS — T6592XA Toxic effect of unspecified substance, intentional self-harm, initial encounter: Secondary | ICD-10-CM

## 2023-02-08 DIAGNOSIS — R441 Visual hallucinations: Secondary | ICD-10-CM

## 2023-02-08 DIAGNOSIS — F17210 Nicotine dependence, cigarettes, uncomplicated: Secondary | ICD-10-CM | POA: Insufficient documentation

## 2023-02-08 LAB — COMPREHENSIVE METABOLIC PANEL
ALT: 16 U/L (ref 0–44)
AST: 19 U/L (ref 15–41)
Albumin: 4.1 g/dL (ref 3.5–5.0)
Alkaline Phosphatase: 56 U/L (ref 38–126)
Anion gap: 8 (ref 5–15)
BUN: 19 mg/dL (ref 6–20)
CO2: 27 mmol/L (ref 22–32)
Calcium: 8.2 mg/dL — ABNORMAL LOW (ref 8.9–10.3)
Chloride: 105 mmol/L (ref 98–111)
Creatinine, Ser: 1.01 mg/dL (ref 0.61–1.24)
GFR, Estimated: >60 mL/min (ref >60)
Glucose, Bld: 101 mg/dL — ABNORMAL HIGH (ref 70–99)
Potassium: 4.4 mmol/L (ref 3.5–5.1)
Sodium: 140 mmol/L (ref 135–145)
Total Bilirubin: 0.4 mg/dL (ref 0.3–1.2)
Total Protein: 7.1 g/dL (ref 6.5–8.1)

## 2023-02-08 LAB — CBC
HCT: 38.1 % — ABNORMAL LOW (ref 39.0–52.0)
Hemoglobin: 12.8 g/dL — ABNORMAL LOW (ref 13.0–17.0)
MCH: 29 pg (ref 26.0–34.0)
MCHC: 33.6 g/dL (ref 30.0–36.0)
MCV: 86.2 fL (ref 80.0–100.0)
Platelets: 262 10*3/uL (ref 150–400)
RBC: 4.42 MIL/uL (ref 4.22–5.81)
RDW: 12.2 % (ref 11.5–15.5)
WBC: 8.9 10*3/uL (ref 4.0–10.5)
nRBC: 0 % (ref 0.0–0.2)

## 2023-02-08 LAB — URINE DRUG SCREEN, QUALITATIVE (ARMC ONLY)
Amphetamines, Ur Screen: NOT DETECTED
Barbiturates, Ur Screen: NOT DETECTED
Benzodiazepine, Ur Scrn: NOT DETECTED
Cannabinoid 50 Ng, Ur ~~LOC~~: NOT DETECTED
Cocaine Metabolite,Ur ~~LOC~~: NOT DETECTED
MDMA (Ecstasy)Ur Screen: NOT DETECTED
Methadone Scn, Ur: NOT DETECTED
Opiate, Ur Screen: NOT DETECTED
Phencyclidine (PCP) Ur S: NOT DETECTED
Tricyclic, Ur Screen: NOT DETECTED

## 2023-02-08 LAB — ACETAMINOPHEN LEVEL: Acetaminophen (Tylenol), Serum: <10 ug/mL — ABNORMAL LOW (ref 10–30)

## 2023-02-08 LAB — ETHANOL: Alcohol, Ethyl (B): <10 mg/dL (ref <10)

## 2023-02-08 LAB — SALICYLATE LEVEL: Salicylate Lvl: <7 mg/dL — ABNORMAL LOW (ref 7.0–30.0)

## 2023-02-08 NOTE — Consult Note (Signed)
Telepsych Consultation   Reason for Consult:  psych evaluation  Referring Physician:  Dr Dionne Bucy MD  Location of Patient: [facility] Location of Provider: Other: [facility]  Patient Identification: Kevin Curry MRN:  [id number] Principal Diagnosis: <principal problem not specified> Diagnosis:  Active Problems:   * No active hospital problems. *   Total Time spent with patient: 20 minutes  Subjective:   Kevin Curry is a 37 y.o. male patient admitted with .  HPI:  Kevin Curry, 37 y/o male with of cocaine abuse, acute psychosis, major depressive disorder, adjustment disorder.  Was seen via telemedicine for psych evaluation due to patient ingesting cleaning supply substances at the facility he has been incarcerated at.  During the initial assessment of patient, before writer could ask the patient any questions he stated I need to stay here because I see demons and shadows over my shoulder.  Patient proceeded to ignore questions asked by the writer.  Patient proceeded to say just fly back in the shadows.  When asked if he is suicidal, patient nodded his head then stated yes but when asked what is his plan he had no answer.  Patient denies HI, reports he heard voices when asked what the voices say, Pt said I hear murmuring but he could not say what the murmuring was all about.  Writer asked patient if the nurse was in the room to get more information from the nurse, however the detention corporal officer was present and he provided more insight and what happened at the facility. Collaborate information from Corporal McDougdal " I got to the facility this afternoon and  I was told that the patient swallowed cleaning agent however I am not familiar with the patient prior to that because I was off for a few days".  Review of patient records show that the solution the patient swallowed was very diluted .  Writer did speak with patient nurse who stated that Poison control had  cleared the patient at 8 PM tonight.  Patient does appear to be malingering for secondary gain.  During the interview process patient initially could not stay still and keep talking about seeing images and demons,  however when Clinical research associate started to tak with both nurse and Biochemist, clinical but was very still and very cooperative.  When patient was told he would be clear to return to facility after medical clearance patient whole demeanor change. Pt does appear to be malingering and manipulating for secondary gain.   Recommend discharge after medical clearance       psychiatric History: see chart  Risk to Self:   Risk to Others:   Prior Inpatient Therapy:   Prior Outpatient Therapy:    Past Medical History:  Past Medical History:  Diagnosis Date   Bipolar 1 disorder (HCC)    History reviewed. No pertinent surgical history. Family History:  Family History  Problem Relation Age of Onset   Heart disease Father    Family Psychiatric  History: unknown Social History:  Social History   Substance and Sexual Activity  Alcohol Use Yes   Comment: occasionaly     Social History   Substance and Sexual Activity  Drug Use Yes   Types: Marijuana, Cocaine    Social History   Socioeconomic History   Marital status: Legally Separated    Spouse name: Not on file   Number of children: Not on file   Years of education: Not on file   Highest education level: Not  on file  Occupational History   Not on file  Tobacco Use   Smoking status: Every Day    Packs/day: 0.50    Years: 5.00    Additional pack years: 0.00    Total pack years: 2.50    Types: Cigarettes   Smokeless tobacco: Never  Substance and Sexual Activity   Alcohol use: Yes    Comment: occasionaly   Drug use: Yes    Types: Marijuana, Cocaine   Sexual activity: Not on file  Other Topics Concern   Not on file  Social History Narrative   Not on file   Social Determinants of Health   Financial Resource Strain: Not on  file  Food Insecurity: Not on file  Transportation Needs: Not on file  Physical Activity: Not on file  Stress: Not on file  Social Connections: Not on file   Additional Social History:    Allergies:   Allergies  Allergen Reactions   Codeine Swelling    Labs:  Results for orders placed or performed during the hospital encounter of 02/08/23 (from the past 48 hour(s))  Comprehensive metabolic panel     Status: Abnormal   Collection Time: 02/08/23  3:04 PM  Result Value Ref Range   Sodium 140 135 - 145 mmol/L   Potassium 4.4 3.5 - 5.1 mmol/L   Chloride 105 98 - 111 mmol/L   CO2 27 22 - 32 mmol/L   Glucose, Bld 101 (H) 70 - 99 mg/dL    Comment: Glucose reference range applies only to samples taken after fasting for at least 8 hours.   BUN 19 6 - 20 mg/dL   Creatinine, Ser 1.61 0.61 - 1.24 mg/dL   Calcium 8.2 (L) 8.9 - 10.3 mg/dL   Total Protein 7.1 6.5 - 8.1 g/dL   Albumin 4.1 3.5 - 5.0 g/dL   AST 19 15 - 41 U/L   ALT 16 0 - 44 U/L   Alkaline Phosphatase 56 38 - 126 U/L   Total Bilirubin 0.4 0.3 - 1.2 mg/dL   GFR, Estimated >09 >60 mL/min    Comment: (NOTE) Calculated using the CKD-EPI Creatinine Equation (2021)    Anion gap 8 5 - 15    Comment: Performed at [facility], [address], [place], [place] [address]  Ethanol     Status: None   Collection Time: 02/08/23  3:04 PM  Result Value Ref Range   Alcohol, Ethyl (B) <10 <10 mg/dL    Comment: (NOTE) Lowest detectable limit for serum alcohol is 10 mg/dL.  For medical purposes only. Performed at [facility], [address] [place], [place] [address]   Salicylate level     Status: Abnormal   Collection Time: 02/08/23  3:04 PM  Result Value Ref Range   Salicylate Lvl <7.0 (L) 7.0 - 30.0 mg/dL    Comment: Performed at [facility], [address] [place], [place] [address]  cbc     Status: Abnormal   Collection Time: 02/08/23  3:04 PM  Result Value Ref Range   WBC 8.9 4.0 - 10.5  K/uL   RBC 4.42 4.22 - 5.81 MIL/uL   Hemoglobin 12.8 (L) 13.0 - 17.0 g/dL   HCT 56.2 (L) 13.0 - 86.5 %   MCV 86.2 80.0 - 100.0 fL   MCH 29.0 26.0 - 34.0 pg   MCHC 33.6 30.0 - 36.0 g/dL   RDW 78.4 69.6 - 29.5 %   Platelets 262 150 - 400 K/uL  nRBC 0.0 0.0 - 0.2 %    Comment: Performed at [facility], [address] [place], [place] [address]  Urine Drug Screen, Qualitative     Status: None   Collection Time: 02/08/23  4:11 PM  Result Value Ref Range   Tricyclic, Ur Screen NONE DETECTED NONE DETECTED   Amphetamines, Ur Screen NONE DETECTED NONE DETECTED   MDMA (Ecstasy)Ur Screen NONE DETECTED NONE DETECTED   Cocaine Metabolite,Ur [place] NONE DETECTED NONE DETECTED   Opiate, Ur Screen NONE DETECTED NONE DETECTED   Phencyclidine (PCP) Ur S NONE DETECTED NONE DETECTED   Cannabinoid 50 Ng, Ur [place] NONE DETECTED NONE DETECTED   Barbiturates, Ur Screen NONE DETECTED NONE DETECTED   Benzodiazepine, Ur Scrn NONE DETECTED NONE DETECTED   Methadone Scn, Ur NONE DETECTED NONE DETECTED    Comment: (NOTE) Tricyclics + metabolites, urine    Cutoff 1000 ng/mL Amphetamines + metabolites, urine  Cutoff 1000 ng/mL MDMA (Ecstasy), urine              Cutoff 500 ng/mL Cocaine Metabolite, urine          Cutoff 300 ng/mL Opiate + metabolites, urine        Cutoff 300 ng/mL Phencyclidine (PCP), urine         Cutoff 25 ng/mL Cannabinoid, urine                 Cutoff 50 ng/mL Barbiturates + metabolites, urine  Cutoff 200 ng/mL Benzodiazepine, urine              Cutoff 200 ng/mL Methadone, urine                   Cutoff 300 ng/mL  The urine drug screen provides only a preliminary, unconfirmed analytical test result and should not be used for non-medical purposes. Clinical consideration and professional judgment should be applied to any positive drug screen result due to possible interfering substances. A more specific alternate chemical method must be used in order to obtain a confirmed analytical  result. Gas chromatography / mass spectrometry (GC/MS) is the preferred confirm atory method. Performed at [facility], [address] [place], [place] [address]   Acetaminophen level     Status: Abnormal   Collection Time: 02/08/23  6:45 PM  Result Value Ref Range   Acetaminophen (Tylenol), Serum <10 (L) 10 - 30 ug/mL    Comment: (NOTE) Therapeutic concentrations vary significantly. A range of 10-30 ug/mL  may be an effective concentration for many patients. However, some  are best treated at concentrations outside of this range. Acetaminophen concentrations >150 ug/mL at 4 hours after ingestion  and >50 ug/mL at 12 hours after ingestion are often associated with  toxic reactions.  Performed at [facility], [address] [place], [place] [address]     Medications:  No current facility-administered medications for this encounter.   Current Outpatient Medications  Medication Sig Dispense Refill   acetaminophen (TYLENOL) 325 MG tablet Take 2 tablets (650 mg total) by mouth every 6 (six) hours as needed for mild pain (or Fever >/= 101). (Patient not taking: Reported on 12/05/2022)     ibuprofen (ADVIL,MOTRIN) 800 MG tablet Take 1 tablet (800 mg total) by mouth every 8 (eight) hours as needed. (Patient not taking: Reported on 04/16/2017) 30 tablet 0    Musculoskeletal: Strength & Muscle Tone: within normal limits Gait & Station: normal Patient leans: N/A          Psychiatric  Specialty Exam:  Presentation  General Appearance:  Appropriate for Environment; Disheveled  Eye Contact: Fleeting  Speech: Clear and Coherent  Speech Volume: Normal  Handedness: Right   Mood and Affect  Mood: Euthymic  Affect: Congruent   Thought Process  Thought Processes: Coherent  Descriptions of Associations:Intact  Orientation:Full (Time, Place and Person)  Thought Content:Logical  History of Schizophrenia/Schizoaffective  disorder:No  Duration of Psychotic Symptoms:Greater than six months  Hallucinations:No data recorded Ideas of Reference:None  Suicidal Thoughts:No data recorded Homicidal Thoughts:No data recorded  Sensorium  Memory: Immediate Good; Recent Good; Remote Good  Judgment: Poor  Insight: Fair   Art therapist  Concentration: Good  Attention Span: Good  Recall: Good  Fund of Knowledge: Good  Language: Good   Psychomotor Activity  Psychomotor Activity:No data recorded  Assets  Assets: Communication Skills; Desire for Improvement; Resilience; Social Support   Sleep  Sleep:No data recorded   Physical Exam: Physical Exam Musculoskeletal:     Cervical back: Normal range of motion.  Neurological:     Mental Status: He is alert.  Psychiatric:        Mood and Affect: Mood normal.        Behavior: Behavior normal.        Thought Content: Thought content normal.        Judgment: Judgment normal.    Review of Systems  Psychiatric/Behavioral:  Positive for substance abuse and suicidal ideas. The patient is nervous/anxious.    Blood pressure (!) 146/89, pulse 64, temperature 98.7 F (37.1 C), temperature source Oral, resp. rate 16, height 6\' 1"  (1.854 m), weight 86.2 kg, SpO2 100 %. Body mass index is 25.07 kg/m.  Treatment Plan Summary: Plan cleared for discharge after medical clearance   Disposition: Patient does not meet criteria for psychiatric inpatient admission.  This service was provided via telemedicine using a 2-way, interactive audio and video technology.  Names of all persons participating in this telemedicine service and their role in this encounter. Name: Sindy Guadeloupe  Role: NP  Name: Kevin Curry  Role: Patient  Name: Mr McDougdal Role: Corporal   Name: Fanny Skates Role: RN    Sindy Guadeloupe, NP 02/09/2023 5:50 AM

## 2023-02-08 NOTE — BH Assessment (Signed)
Comprehensive Clinical Assessment (CCA) Note  02/08/2023 Kevin Curry [id number]  Chief Complaint: Patient is a 37 year old male presenting to [facility] ED in [place] Police custody. Per triage note Pt arrives in [facility] Dept custody from jail after ingesting an unknown amount of an unknown cleaning substance. Substance is in an unlabeled bottle and contents are unknown. During assessment patient continues to have police by his side, he appears alert and oriented x2, cooperative and calm, thoughts are disorganized and having delusions. Patient reports why he is presenting to th ED "I wanted to fly." Police officer that is presenting with the patient reports that the patient ingested a cleaning product and has plans on returning the patient back to jail, it is unclear of what the patient is in jail for. Patient has been seen in this ED recently in April but for Cocaine abuse, currently the patient's UDS is clean. Patient reports AH "they tell me to fly", when asked about how long he has been experiencing AH he reports "since my Grandma left, he laid next to me." Patient also reports [facility] "the devil, they came close." When asked about SI he reports "sometimes", he denies HI.  Chief Complaint  Patient presents with   Ingestion   Visit Diagnosis: Acute Psychosis    CCA Screening, Triage and Referral (STR)  Patient Reported Information How did you hear about [place]? Legal System  Referral name: No data recorded Referral phone number: No data recorded  Whom do you see for routine medical problems? No data recorded Practice/Facility Name: No data recorded Practice/Facility Phone Number: No data recorded Name of Contact: No data recorded Contact Number: No data recorded Contact Fax Number: No data recorded Prescriber Name: No data recorded Prescriber Address (if known): No data recorded  What Is the Reason for Your Visit/Call Today? Pt arrives in [facility] Dept  custody from jail after ingesting an unknown amount of an unknown cleaning substance. Substance is in an unlabeled bottle and contents are unknown. RN placed a call to facility to request MSD sheet but they do not have that information available. Pt denies symptoms and states he feels normal.  How Long Has This Been Causing You Problems? > than 6 months  What Do You Feel Would Help You the Most Today? No data recorded  Have You Recently Been in Any Inpatient Treatment (Hospital/Detox/Crisis Center/28-Day Program)? No data recorded Name/Location of Program/Hospital:No data recorded How Long Were You There? No data recorded When Were You Discharged? No data recorded  Have You Ever Received Services From [facility] Before? No data recorded Who Do You See at [facility]? No data recorded  Have You Recently Had Any Thoughts About Hurting Yourself? -- (UTA)  Are You Planning to Commit Suicide/Harm Yourself At This time? -- (Patient reports "sometimes")   Have you Recently Had Thoughts About Hurting Someone Kevin Curry? No  Explanation: No data recorded  Have You Used Any Alcohol or Drugs in the Past 24 Hours? No  How Long Ago Did You Use Drugs or Alcohol? No data recorded What Did You Use and How Much? No data recorded  Do You Currently Have a Therapist/Psychiatrist? No data recorded Name of Therapist/Psychiatrist: Patient is currently under police custody and a inmate at [facility] jail   Have You Been Recently Discharged From Any Office Practice or Programs? No  Explanation of Discharge From Practice/Program: No data recorded    CCA Screening Triage Referral Assessment Type of Contact: Face-to-Face  Is this Initial or Reassessment? No data recorded Date Telepsych consult ordered in [facility]:  No data recorded Time Telepsych consult ordered in [facility]:  No data recorded  Patient Reported Information Reviewed? No data recorded Patient Left Without Being Seen? No data recorded Reason for Not  Completing Assessment: No data recorded  Collateral Involvement: No data recorded  Does Patient Have a Court Appointed Legal Guardian? No data recorded Name and Contact of Legal Guardian: No data recorded If Minor and Not Living with Parent(s), Who has Custody? No data recorded Is CPS involved or ever been involved? Never  Is APS involved or ever been involved? Never   Patient Determined To Be At Risk for Harm To Self or Others Based on Review of Patient Reported Information or Presenting Complaint? No  Method: No data recorded Availability of Means: No data recorded Intent: No data recorded Notification Required: No data recorded Additional Information for Danger to Others Potential: No data recorded Additional Comments for Danger to Others Potential: No data recorded Are There Guns or Other Weapons in Your Home? No  Types of Guns/Weapons: No data recorded Are These Weapons Safely Secured?                            No data recorded Who Could Verify You Are Able To Have These Secured: No data recorded Do You Have any Outstanding Charges, Pending Court Dates, Parole/Probation? No data recorded Contacted To Inform of Risk of Harm To Self or Others: No data recorded  Location of Assessment: [facility] ED   Does Patient Present under Involuntary Commitment? No  IVC Papers Initial File Date: No data recorded  [place] of Residence: [place]   Patient Currently Receiving the Following Services: No data recorded  Determination of Need: Emergent (2 hours)   Options For Referral: No data recorded    CCA Biopsychosocial Intake/Chief Complaint:  No data recorded Current Symptoms/Problems: No data recorded  Patient Reported Schizophrenia/Schizoaffective Diagnosis in Past: No   Strengths: Patient is able to communicate his needs  Preferences: No data recorded Abilities: No data recorded  Type of Services Patient Feels are Needed: No data recorded  Initial Clinical  Notes/Concerns: No data recorded  Mental Health Symptoms Depression:   Change in energy/activity; Fatigue; Hopelessness   Duration of Depressive symptoms:  Greater than two weeks   Mania:   None   Anxiety:    None   Psychosis:   Delusions; Hallucinations   Duration of Psychotic symptoms:  Greater than six months   Trauma:   None   Obsessions:   None   Compulsions:   Absent insight/delusional; "Driven" to perform behaviors/acts; Poor Insight   Inattention:   None   Hyperactivity/Impulsivity:   None   Oppositional/Defiant Behaviors:   None   Emotional Irregularity:   None   Other Mood/Personality Symptoms:  No data recorded   Mental Status Exam Appearance and self-care  Stature:   Average   Weight:   Average weight   Clothing:   Casual   Grooming:   Normal   Cosmetic use:   None   Posture/gait:   Normal   Motor activity:   Not Remarkable   Sensorium  Attention:   Normal   Concentration:   Normal   Orientation:   Person   Recall/memory:   Defective in Short-term; Defective in Recent   Affect and Mood  Affect:   Appropriate   Mood:   Anxious  Relating  Eye contact:   Normal   Facial expression:   Responsive   Attitude toward examiner:   Cooperative   Thought and Language  Speech flow:  Flight of Ideas   Thought content:   Delusions   Preoccupation:   Ruminations   Hallucinations:   Auditory; Visual   Organization:  No data recorded  [employer] of Knowledge:   Fair   Intelligence:   Average   Abstraction:   Functional   Judgement:   Poor   Reality Testing:   Distorted   Insight:   Lacking; Poor   Decision Making:   Impulsive   Social Functioning  Social Maturity:   Impulsive   Social Judgement:   Heedless   Stress  Stressors:   Optometrist Ability:   Contractor Deficits:   None   Supports:   Support needed      Religion: Religion/Spirituality Are You A Religious Person?: No  Leisure/Recreation: Leisure / Recreation Do You Have Hobbies?: No  Exercise/Diet: Exercise/Diet Do You Exercise?: No Have You Gained or Lost A Significant Amount of Weight in the Past Six Months?: No Do You Follow a Special Diet?: No Do You Have Any Trouble Sleeping?: No   CCA Employment/Education Employment/Work Situation: Employment / Work Situation Employment Situation: Unemployed Patient's Job has Been Impacted by Current Illness: No Has Patient ever Been in Equities trader?: No  Education: Education Is Patient Currently Attending School?: No Did You Have An Individualized Education Program (IIEP): No Did You Have Any Difficulty At [employer]?: No Patient's Education Has Been Impacted by Current Illness: No   CCA Family/Childhood History Family and Relationship History: Family history Marital status: Single Does patient have children?:  (Unknown)  Childhood History:  Childhood History Did patient suffer any verbal/emotional/physical/sexual abuse as a child?:  (UTA) Did patient suffer from severe childhood neglect?:  (UTA) Has patient ever been sexually abused/assaulted/raped as an adolescent or adult?:  (UTA) Was the patient ever a victim of a crime or a disaster?:  (UTA) Witnessed domestic violence?:  (UTA) Has patient been affected by domestic violence as an adult?:  Industrial/product designer)  Child/Adolescent Assessment:     CCA Substance Use Alcohol/Drug Use: Alcohol / Drug Use Pain Medications: SEE MAR Prescriptions: SEE MAR Over the Counter: SEE MAR History of alcohol / drug use?:  (UTA)                         ASAM's:  Six Dimensions of Multidimensional Assessment  Dimension 1:  Acute Intoxication and/or Withdrawal Potential:      Dimension 2:  Biomedical Conditions and Complications:      Dimension 3:  Emotional, Behavioral, or Cognitive Conditions and Complications:     Dimension 4:   Readiness to Change:     Dimension 5:  Relapse, Continued use, or Continued Problem Potential:     Dimension 6:  Recovery/Living Environment:     ASAM Severity Score:    ASAM Recommended Level of Treatment:     Substance use Disorder (SUD)    Recommendations for Services/Supports/Treatments:    DSM5 Diagnoses: Patient Active Problem List   Diagnosis Date Noted   Cocaine abuse with cocaine-induced mood disorder (HCC) 12/06/2022   Cellulitis of right arm 03/31/2020    Patient Centered Plan: Patient is on the following Treatment Plan(s):  Impulse Control   Referrals to Alternative Service(s): Referred to Alternative Service(s):   Place:   Date:  Time:    Referred to Alternative Service(s):   Place:   Date:   Time:    Referred to Alternative Service(s):   Place:   Date:   Time:    Referred to Alternative Service(s):   Place:   Date:   Time:      @BHCOLLABOFCARE @  [employer], LCAS-A

## 2023-02-08 NOTE — ED Provider Notes (Signed)
[facility] Provider Note    Event Date/Time   First MD Initiated Contact with Patient 02/08/23 1505     (approximate)   History   Ingestion   HPI  Kevin Curry is a 37 y.o. male with no significant PMH presenting from jail in police custody who presents for an ingestion of an unknown substance.  Per jail staff, the patient drank from a bottle of cleaning fluid that is what is given to the inmates to clean the rooms.  It is apparently heavily diluted in case they try to drink it.  However, nursing staff attempted to obtain further information from the jail including a data sheet, but the contents of the bottle are unknown.  The patient himself is unable to give any reliable history; he states his name is Kevin Curry.  The patient tells me that he is here because he wanted to "fly" in order to be next to his grandfather, although he will not tell me if his grandfather is alive or dead.  He states he is hearing a demon on his left shoulder speaking to him although he states it is supposed to be an Chief Technology Officer.  The patient denies any nausea or vomiting, acute abdominal or other pain, or any other acute physical symptoms.  I reviewed the past medical records. The patient was most recently admitted to the hospitalist service in 2021 for arm cellulitis.  He was seen in the ED and evaluated by psychiatry on 4/8 due to suicidal ideation after being arrested but was cleared by psychiatry and did not require admission at that time.   Physical Exam   Triage Vital Signs: ED Triage Vitals  Enc Vitals Group     BP 02/08/23 1439 (!) 142/88     Pulse Rate 02/08/23 1439 73     Resp 02/08/23 1439 18     Temp 02/08/23 1439 99 F (37.2 C)     Temp Source 02/08/23 1439 Oral     SpO2 02/08/23 1439 95 %     Weight 02/08/23 1440 190 lb (86.2 kg)     Height 02/08/23 1440 6\' 1"  (1.854 m)     Head Circumference --      Peak Flow --      Pain Score 02/08/23 1440 0     Pain Loc --       Pain Edu? --      Excl. in GC? --     Most recent vital signs: Vitals:   02/08/23 1850 02/08/23 1915  BP: 128/88 (!) 146/89  Pulse: (!) 58 64  Resp: 15   Temp: 98.7 F (37.1 C)   SpO2: 98% 100%     General: Awake, no distress.  CV:  Good peripheral perfusion.  Resp:  Normal effort.  Abd:  No distention.  Other:  Disorganized thought.   ED Results / Procedures / Treatments   Labs (all labs ordered are listed, but only abnormal results are displayed) Labs Reviewed  COMPREHENSIVE METABOLIC PANEL - Abnormal; Notable for the following components:      Result Value   Glucose, Bld 101 (*)    Calcium 8.2 (*)    All other components within normal limits  SALICYLATE LEVEL - Abnormal; Notable for the following components:   Salicylate Lvl <7.0 (*)    All other components within normal limits  CBC - Abnormal; Notable for the following components:   Hemoglobin 12.8 (*)    HCT 38.1 (*)  All other components within normal limits  ACETAMINOPHEN LEVEL - Abnormal; Notable for the following components:   Acetaminophen (Tylenol), Serum <10 (*)    All other components within normal limits  ETHANOL  URINE DRUG SCREEN, QUALITATIVE ([facility] ONLY)     EKG  ED ECG REPORT I, Dionne Bucy, the attending physician, personally viewed and interpreted this ECG.  Date: 02/08/2023 EKG Time: 1602 Rate: 59 Rhythm: normal sinus rhythm QRS Axis: normal Intervals: normal ST/T Wave abnormalities: normal Narrative Interpretation: no evidence of acute ischemia    RADIOLOGY    PROCEDURES:  Critical Care performed: No  Procedures   MEDICATIONS ORDERED IN ED: Medications - No data to display   IMPRESSION / MDM / ASSESSMENT AND PLAN / ED COURSE  I reviewed the triage vital signs and the nursing notes.  37 year old male with no active medical problems presents from jail after ingesting an unknown amount of an unknown liquid cleaning substance.  He has no physical  symptoms at this time.  Vital signs and physical exam are unremarkable.  However, the patient is making nonsensical statements including that his name is Kevin Curry and that he wanted to fly.  Differential diagnosis includes, but is not limited to, acute psychosis, major depressive disorder with psychotic features, adjustment disorder, substance-induced disorder, malingering.   The best information we can obtain from the jail is that the liquid is a diluted cleaning liquid or soap.  The patient has no acute symptoms at this time.  There is no evidence that he drank bleach or another caustic or significantly toxic substance.  We will obtain EKG and lab workup and observe the patient for at least 6 to 8 hours for the development of any symptoms.  I have ordered psychiatry and TTS consult.  Patient's presentation is most consistent with acute presentation with potential threat to life or bodily function.  The patient has been placed in psychiatric observation due to the need to provide a safe environment for the patient while obtaining psychiatric consultation and evaluation, as well as ongoing medical and medication management to treat the patient's condition.  The patient has not been placed under full IVC at this time.   ----------------------------------------- 8:46 PM on 02/08/2023 -----------------------------------------  Repeat APAP level is negative.  The patient has not had any physical symptoms while in the ED.  He is tolerating p.o.  We have consulted with poison control center and there is no recommendation for further workup.  The patient is medically cleared.  ----------------------------------------- 11:12 PM on 02/08/2023 -----------------------------------------  Per charge RN Erie Noe, the patient has been evaluated telepsychiatry and is cleared for discharge back to jail.  Return precautions have been provided.  FINAL CLINICAL IMPRESSION(S) / ED DIAGNOSES   Final diagnoses:   Ingestion of substance, intentional self-harm, initial encounter (HCC)     Rx / DC Orders   ED Discharge Orders     None        Note:  This document was prepared using Dragon voice recognition software and may include unintentional dictation errors.    Dionne Bucy, MD 02/08/23 [phone]

## 2023-02-08 NOTE — ED Notes (Signed)
Spoke with tele pysch NP provider, states that patient could go back to "his facility" when medically cleared. MD and charge RN notified

## 2023-02-08 NOTE — ED Notes (Signed)
Patient vitals stable at this time. Law enforcement at bedside. No distress noted. Patient requested warm blanket at this time. Denies any other needs.

## 2023-02-08 NOTE — ED Notes (Addendum)
See triage note, pt reports drinking unknown cleaning solution while staff cleaning cells. Pt reports "wanted to fly home". Reports hearing voices for a couple years. Pt reserved and quiet.

## 2023-02-08 NOTE — ED Triage Notes (Signed)
Pt arrives in [facility] custody from jail after ingesting an unknown amount of an unknown cleaning substance. Substance is in an unlabeled bottle and contents are unknown. RN placed a call to facility to request MSD sheet but they do not have that information available. Pt denies symptoms and states he feels normal.

## 2023-02-08 NOTE — ED Notes (Signed)
Per poison control talking to Bri, RN told to do acetaminophen level around 1645 which was drawn and sent to lab. Talked to MD due to patient having no s/s during observation period NPO can be lifted. Patient given sandwich tray at this time and coke.

## 2023-02-08 NOTE — ED Notes (Addendum)
Spoke with [place] with poison control. Per poison control - Keep NPO for 4 hours, see if sx develop such as nausea, difficulty swallowing, difficulty breathing. Observe for physical sx 4 hours Recommend EKG, cmp now, acetaminophen post 4 hour, salicylate level now   PO fluid challenge in 4 hours

## 2023-02-08 NOTE — ED Notes (Signed)
Patient informed he is NPO and cannot have anything to eat or drink at this time due to poison control recommendations of him being NPO until approximately 1900. MD also aware.

## 2023-02-08 NOTE — ED Notes (Signed)
VOL/  PENDING  CONSULT
